# Patient Record
Sex: Female | Born: 1965 | Race: Black or African American | Hispanic: No | Smoking: Former smoker
Health system: Southern US, Community
[De-identification: ages and names within clinical notes are randomized; demographics above are authoritative.]

## PROBLEM LIST (undated history)

## (undated) DIAGNOSIS — E669 Obesity, unspecified: Secondary | ICD-10-CM

## (undated) DIAGNOSIS — J45909 Unspecified asthma, uncomplicated: Secondary | ICD-10-CM

## (undated) DIAGNOSIS — I10 Essential (primary) hypertension: Secondary | ICD-10-CM

## (undated) HISTORY — PX: TUBAL LIGATION: SHX77

## (undated) HISTORY — PX: BREAST REDUCTION SURGERY: SHX8

---

## 2004-09-04 ENCOUNTER — Emergency Department: Payer: Self-pay | Admitting: Emergency Medicine

## 2004-10-03 HISTORY — PX: REDUCTION MAMMAPLASTY: SUR839

## 2005-08-05 ENCOUNTER — Other Ambulatory Visit: Payer: Self-pay

## 2005-08-05 ENCOUNTER — Emergency Department: Payer: Self-pay | Admitting: Emergency Medicine

## 2005-08-08 ENCOUNTER — Ambulatory Visit: Payer: Self-pay | Admitting: Emergency Medicine

## 2012-03-02 ENCOUNTER — Ambulatory Visit: Payer: Self-pay | Admitting: Family Medicine

## 2012-09-30 ENCOUNTER — Emergency Department: Payer: Self-pay | Admitting: Emergency Medicine

## 2013-07-18 ENCOUNTER — Emergency Department: Payer: Self-pay | Admitting: Emergency Medicine

## 2013-07-18 LAB — BASIC METABOLIC PANEL
Calcium, Total: 8.9 mg/dL (ref 8.5–10.1)
Chloride: 110 mmol/L — ABNORMAL HIGH (ref 98–107)
EGFR (Non-African Amer.): >60
Glucose: 82 mg/dL (ref 65–99)
Osmolality: 277 (ref 275–301)
Sodium: 140 mmol/L (ref 136–145)

## 2013-07-18 LAB — CBC
HGB: 11.1 g/dL — ABNORMAL LOW (ref 12.0–16.0)
MCH: 24.5 pg — ABNORMAL LOW (ref 26.0–34.0)
MCHC: 32.3 g/dL (ref 32.0–36.0)
Platelet: 227 10*3/uL (ref 150–440)
RBC: 4.54 10*6/uL (ref 3.80–5.20)
RDW: 15.2 % — ABNORMAL HIGH (ref 11.5–14.5)

## 2013-07-18 LAB — TROPONIN I: Troponin-I: <0.02 ng/mL

## 2013-07-19 LAB — TROPONIN I: Troponin-I: <0.02 ng/mL

## 2014-09-11 DIAGNOSIS — I10 Essential (primary) hypertension: Secondary | ICD-10-CM | POA: Insufficient documentation

## 2014-09-11 DIAGNOSIS — R399 Unspecified symptoms and signs involving the genitourinary system: Secondary | ICD-10-CM | POA: Insufficient documentation

## 2014-09-11 DIAGNOSIS — E039 Hypothyroidism, unspecified: Secondary | ICD-10-CM | POA: Insufficient documentation

## 2014-09-11 HISTORY — DX: Hypothyroidism, unspecified: E03.9

## 2014-09-11 HISTORY — DX: Essential (primary) hypertension: I10

## 2014-12-13 ENCOUNTER — Emergency Department: Payer: Self-pay | Admitting: Emergency Medicine

## 2015-11-17 ENCOUNTER — Emergency Department
Admission: EM | Admit: 2015-11-17 | Discharge: 2015-11-17 | Disposition: A | Discharge status: Home / Self Care | Payer: Managed Care, Other (non HMO) | Attending: Emergency Medicine | Admitting: Emergency Medicine

## 2015-11-17 ENCOUNTER — Emergency Department: Payer: Managed Care, Other (non HMO)

## 2015-11-17 ENCOUNTER — Encounter: Payer: Self-pay | Admitting: Medical Oncology

## 2015-11-17 DIAGNOSIS — R05 Cough: Secondary | ICD-10-CM | POA: Diagnosis not present

## 2015-11-17 DIAGNOSIS — F17200 Nicotine dependence, unspecified, uncomplicated: Secondary | ICD-10-CM | POA: Insufficient documentation

## 2015-11-17 DIAGNOSIS — Z880 Allergy status to penicillin: Secondary | ICD-10-CM | POA: Insufficient documentation

## 2015-11-17 DIAGNOSIS — R079 Chest pain, unspecified: Secondary | ICD-10-CM | POA: Diagnosis present

## 2015-11-17 DIAGNOSIS — R0789 Other chest pain: Secondary | ICD-10-CM | POA: Diagnosis not present

## 2015-11-17 DIAGNOSIS — I10 Essential (primary) hypertension: Secondary | ICD-10-CM | POA: Insufficient documentation

## 2015-11-17 DIAGNOSIS — Z79899 Other long term (current) drug therapy: Secondary | ICD-10-CM | POA: Insufficient documentation

## 2015-11-17 DIAGNOSIS — R059 Cough, unspecified: Secondary | ICD-10-CM

## 2015-11-17 HISTORY — DX: Essential (primary) hypertension: I10

## 2015-11-17 LAB — BASIC METABOLIC PANEL
ANION GAP: 7 (ref 5–15)
BUN: 8 mg/dL (ref 6–20)
CALCIUM: 9.3 mg/dL (ref 8.9–10.3)
CO2: 28 mmol/L (ref 22–32)
Chloride: 105 mmol/L (ref 101–111)
Creatinine, Ser: 0.81 mg/dL (ref 0.44–1.00)
GFR calc Af Amer: >60 mL/min (ref >60)
GLUCOSE: 101 mg/dL — AB (ref 65–99)
Potassium: 3.7 mmol/L (ref 3.5–5.1)
SODIUM: 140 mmol/L (ref 135–145)

## 2015-11-17 LAB — CBC
HCT: 40.5 % (ref 35.0–47.0)
HEMOGLOBIN: 12.9 g/dL (ref 12.0–16.0)
MCH: 25.6 pg — ABNORMAL LOW (ref 26.0–34.0)
MCHC: 31.9 g/dL — AB (ref 32.0–36.0)
MCV: 80.2 fL (ref 80.0–100.0)
Platelets: 180 10*3/uL (ref 150–440)
RBC: 5.05 MIL/uL (ref 3.80–5.20)
RDW: 14.3 % (ref 11.5–14.5)
WBC: 5.6 10*3/uL (ref 3.6–11.0)

## 2015-11-17 LAB — TROPONIN I

## 2015-11-17 MED ORDER — KETOROLAC TROMETHAMINE 30 MG/ML IJ SOLN
30.0000 mg | Freq: Once | INTRAMUSCULAR | Status: AC
  Administered 2015-11-17: 30 mg via INTRAVENOUS
  Filled 2015-11-17: qty 1

## 2015-11-17 MED ORDER — NAPROXEN SODIUM 275 MG PO TABS: 275.0000 mg | ORAL_TABLET | Freq: Two times a day (BID) | ORAL | Status: AC

## 2015-11-17 MED ORDER — HYDROCHLOROTHIAZIDE 25 MG PO TABS
25.0000 mg | ORAL_TABLET | Freq: Every day | ORAL | Status: DC
  Administered 2015-11-17: 25 mg via ORAL
  Filled 2015-11-17: qty 1

## 2015-11-17 NOTE — ED Notes (Signed)
Pt reports she was at work this am when she began having left sided sharp pain to chest, pain is also in left arm. Pt denies sob.

## 2015-11-17 NOTE — Discharge Instructions (Signed)
Chest Wall Pain You have prefer to go home at this time which is certainly not unreasonable. However, we do notice that your blood pressure is quite elevated. Please follow-up with your doctor tomorrow. If you have any change in your chest pain, change your mind about further workup, have shortness of breath, nausea or vomiting, headache, or you feel worse in any way return to the emergency room. Take your regular home medications without fail and to see your doctor today or tomorrow for further evaluation. Chest wall pain is pain in or around the bones and muscles of your chest. Sometimes, an injury causes this pain. Sometimes, the cause may not be known. This pain may take several weeks or longer to get better. HOME CARE INSTRUCTIONS  Pay attention to any changes in your symptoms. Take these actions to help with your pain:   Rest as told by your health care provider.   Avoid activities that cause pain. These include any activities that use your chest muscles or your abdominal and side muscles to lift heavy items.   If directed, apply ice to the painful area:  Put ice in a plastic bag.  Place a towel between your skin and the bag.  Leave the ice on for 20 minutes, 2-3 times per day.  Take over-the-counter and prescription medicines only as told by your health care provider.  Do not use tobacco products, including cigarettes, chewing tobacco, and e-cigarettes. If you need help quitting, ask your health care provider.  Keep all follow-up visits as told by your health care provider. This is important. SEEK MEDICAL CARE IF:  You have a fever.  Your chest pain becomes worse.  You have new symptoms. SEEK IMMEDIATE MEDICAL CARE IF:  You have nausea or vomiting.  You feel sweaty or light-headed.  You have a cough with phlegm (sputum) or you cough up blood.  You develop shortness of breath.   This information is not intended to replace advice given to you by your health care  provider. Make sure you discuss any questions you have with your health care provider.   Document Released: 09/19/2005 Document Revised: 06/10/2015 Document Reviewed: 12/15/2014 Elsevier Interactive Patient Education [employer].

## 2015-11-17 NOTE — ED Notes (Signed)
Pt has hx HTN, is supposed to take medication but says she has been unable to fill her prescription recently d/t only having insurance 9/12 months of the year. Pt given info on medication management resources. States she will call this week.

## 2015-11-17 NOTE — ED Provider Notes (Addendum)
[facility] Emergency Department Provider Note  ____________________________________________   I have reviewed the triage vital signs and the nursing notes.   HISTORY  Chief Complaint Chest Pain    HPI Peggy Villa is a 50 y.o. female presents today complaining of left-sided chest wall pain. Patient has been coughing and she states she had pain this morning when she coughed "real hard" once. And then, she was reaching down to pick something up with her left arm and hurt her chest wall even more. She has pain in the left chest wall when she changes position or when she touches it or when she lifts her arm. She has had no fever or chills. She denies any leg swelling. She states that the only shortness of breath she has is when she coughs. She states the cough is occasionally productive. She has no numbness or weakness. When I ask her to reproduce the pain she touches the chest wall states "it's sore right here" and she also can reproduce it by moving her left arm.She denies any personal or family history of PE or DVT, she has had no recent travel takes no exogenous estrogens and has had no lower extremity swelling. She denies any recent surgery. He also denies family history of CAD. Patient is an active smoker with a history of hypertension  Past Medical History  Diagnosis Date  . Hypertension     There are no active problems to display for this patient.   Past Surgical History  Procedure Laterality Date  . Breast reduction surgery      Current Outpatient Rx  Name  Route  Sig  Dispense  Refill  . dextromethorphan-guaiFENesin (MUCINEX DM) 30-600 MG 12hr tablet   Oral   Take 1 tablet by mouth at bedtime.         . naproxen sodium (ANAPROX) 275 MG tablet   Oral   Take 1 tablet (275 mg total) by mouth 2 (two) times daily with a meal.   30 tablet   2     Allergies Milk-related compounds; Peanut-containing drug products; and Penicillins  No family  history on file.  Social History Social History  Substance Use Topics  . Smoking status: Current Every Day Smoker  . Smokeless tobacco: None  . Alcohol Use: Yes     Comment: socially    Review of Systems Constitutional: No fever/chills Eyes: No visual changes. ENT: No sore throat. No stiff neck no neck pain Cardiovascular:See history of present illness regardingt pain. Respiratory:Positive cough.  Gastrointestinal:   no vomiting.  No diarrhea.  No constipation. Genitourinary: Negative for dysuria. Musculoskeletal: Negative lower extremity swelling Skin: Negative for rash. Neurological: Negative for headaches, focal weakness or numbness. 10-point ROS otherwise negative.  ____________________________________________   PHYSICAL EXAM:  VITAL SIGNS: ED Triage Vitals  Enc Vitals Group     BP 11/17/15 0942 196/103 mmHg     Pulse Rate 11/17/15 0942 75     Resp 11/17/15 0942 20     Temp 11/17/15 0942 98.3 F (36.8 C)     Temp Source 11/17/15 0942 Oral     SpO2 11/17/15 0942 99 %     Weight 11/17/15 0942 273 lb (123.832 kg)     Height 11/17/15 0942 5\' 8"  (1.727 m)     Head Cir --      Peak Flow --      Pain Score 11/17/15 0943 6     Pain Loc --  Pain Edu? --      Excl. in [place]? --     Constitutional: Alert and oriented. Well appearing and in no acute distress. Eyes: Conjunctivae are normal. PERRL. EOMI. Head: Atraumatic. Nose: No congestion/rhinnorhea. Mouth/Throat: Mucous membranes are moist.  Oropharynx non-erythematous. Neck: No stridor.   Nontender with no meningismus  chest: Tender to palpation left chest wall and I touch the pectoralis muscle patient states "ouch that's the pain right there" and pulls back. I can also list this pain by having her lift something heavy with a left arm or push me away with her left arm. There is no crepitus there is no flail chest. Cardiovascular: Normal rate, regular rhythm. Grossly normal heart sounds.  Good peripheral  circulation. Respiratory: Normal respiratory effort.  No retractions. Lungs CTAB. Abdominal: Soft and nontender. No distention. No guarding no rebound Back:  There is no focal tenderness or step off there is no midline tenderness there are no lesions noted. there is no CVA tenderness Musculoskeletal: No lower extremity tenderness. No joint effusions, no DVT signs strong distal pulses no edema Neurologic:  Normal speech and language. No gross focal neurologic deficits are appreciated.  Skin:  Skin is warm, dry and intact. No rash noted. Psychiatric: Mood and affect are normal. Speech and behavior are normal.  ____________________________________________   LABS (all labs ordered are listed, but only abnormal results are displayed)  Labs Reviewed  BASIC METABOLIC PANEL - Abnormal; Notable for the following:    Glucose, Bld 101 (*)    All other components within normal limits  CBC - Abnormal; Notable for the following:    MCH 25.6 (*)    MCHC 31.9 (*)    All other components within normal limits  TROPONIN I  TROPONIN I   ____________________________________________  EKG  I personally interpreted any EKGs ordered by me or triage Normal sinus rhythm at 71 bpm possible old anterior infarct previously seen on prior EKG, no acute ST elevation or acute ST depression.  ____________________________________________  [id number]  I reviewed any imaging ordered by me or triage that were performed during my shift ____________________________________________   PROCEDURES  Procedure(s) performed: None  Critical Care performed: None  ____________________________________________   INITIAL IMPRESSION / ASSESSMENT AND PLAN / ED COURSE  Pertinent labs & imaging results that were available during my care of the patient were reviewed by me and considered in my medical decision making (see chart for details). Patient presents with very reproducible chest wall pain in the context of a cough.  Made worse by having lifting. Very low suspicion for ACS but she does have risk factors therefore we'll check cardiac enzymes and reassess. I will give her Toradol to see if I can help her pain. Her abdomen is benign there is no evidence of referred abdominal pain. History and physical not suggestive of PE or dissection at this time.   ----------------------------------------- 1:22 PM on 11/17/2015 -----------------------------------------  Patient remains absolutely comfortable and less I touch her chest wall she changes position the runway. The pain medication greatly helped her discomfort. Her blood pressure somewhat elevated here, however she did not take her morning blood pressure medication. We'll give that to her. We will keep her for second cardiac markers a precaution although again very low suspicion for ACS on this patient. That is negative, I feel that she'll be safe for discharge.At this time, there does not appear to be clinical evidence to support the diagnosis of pulmonary embolus, dissection, myocarditis, endocarditis, pericarditis, pericardial tamponade,  acute coronary syndrome, pneumothorax, pneumonia, or any other acute intrathoracic pathology that will require admission or acute intervention. Nor is there evidence of any significant intra-abdominal pathology causing this discomfort. Patient feels very comfortable with this plan  ----------------------------------------- 3:35 PM on 11/17/2015 -----------------------------------------  Patient states he be some blood pressures between 180 and 190 on an average day. This is where we are approximately. She did not take her blood pressure medications so was slightly higher than this. It is trending down. She would very much prefer not to stay in the hospital for 1 minute longer. She E discharge. She does not wish to be admitted to the hospital. She states she feels much better. Given that there is no evidence of ongoing ischemia, she is  at her baseline blood pressure she has very reproducible chest wall pain, and she has 2 negative troponins and she is requesting discharge we will discharge her. She has been discussing with her primary care doctor already and they will follow-up today or tomorrow for a change in her baseline blood pressure medication we have advised her very strongly to be compliant. Patient does state that she normally only takes half her medications. She has no headache no evidence of acute renal injury, and again she states this is her baseline blood pressure. ____________________________________________   FINAL CLINICAL IMPRESSION(S) / ED DIAGNOSES  Final diagnoses:  Cough  Chest wall pain      This chart was dictated using voice recognition software.  Despite best efforts to proofread,  errors can occur which can change meaning.     Schuyler Amor, MD 11/17/15 1016  Schuyler Amor, MD 11/17/15 [place], MD 11/17/15 [place], MD 11/17/15 [phone]

## 2016-01-05 ENCOUNTER — Ambulatory Visit: Payer: Self-pay | Admitting: Unknown Physician Specialty

## 2016-03-03 ENCOUNTER — Emergency Department: Payer: Self-pay

## 2016-03-03 ENCOUNTER — Emergency Department
Admission: EM | Admit: 2016-03-03 | Discharge: 2016-03-03 | Disposition: A | Discharge status: Home / Self Care | Payer: Self-pay | Attending: Emergency Medicine | Admitting: Emergency Medicine

## 2016-03-03 DIAGNOSIS — N390 Urinary tract infection, site not specified: Secondary | ICD-10-CM | POA: Insufficient documentation

## 2016-03-03 DIAGNOSIS — F17200 Nicotine dependence, unspecified, uncomplicated: Secondary | ICD-10-CM | POA: Insufficient documentation

## 2016-03-03 DIAGNOSIS — I10 Essential (primary) hypertension: Secondary | ICD-10-CM | POA: Insufficient documentation

## 2016-03-03 LAB — URINALYSIS COMPLETE WITH MICROSCOPIC (ARMC ONLY)
Bilirubin Urine: NEGATIVE
Glucose, UA: NEGATIVE mg/dL
KETONES UR: NEGATIVE mg/dL
Nitrite: NEGATIVE
PH: 9 — AB (ref 5.0–8.0)
PROTEIN: 100 mg/dL — AB
SPECIFIC GRAVITY, URINE: 1.015 (ref 1.005–1.030)
Squamous Epithelial / LPF: NONE SEEN

## 2016-03-03 LAB — POCT PREGNANCY, URINE: PREG TEST UR: NEGATIVE

## 2016-03-03 MED ORDER — ONDANSETRON 4 MG PO TBDP
4.0000 mg | ORAL_TABLET | Freq: Once | ORAL | Status: AC
  Administered 2016-03-03: 4 mg via ORAL
  Filled 2016-03-03: qty 1

## 2016-03-03 MED ORDER — CIPROFLOXACIN HCL 500 MG PO TABS
500.0000 mg | ORAL_TABLET | Freq: Two times a day (BID) | ORAL | Status: AC
Start: 2016-03-03 — End: 2016-03-12

## 2016-03-03 MED ORDER — CIPROFLOXACIN HCL 500 MG PO TABS
500.0000 mg | ORAL_TABLET | Freq: Once | ORAL | Status: AC
  Administered 2016-03-03: 500 mg via ORAL
  Filled 2016-03-03: qty 1

## 2016-03-03 NOTE — ED Provider Notes (Signed)
[facility] Emergency Department Provider Note  ____________________________________________  Time seen: 3:10 AM  I have reviewed the triage vital signs and the nursing notes.   HISTORY  Chief Complaint Dysuria      HPI Peggy Villa is a 50 y.o. female presents with acute onset of dysuria, urinary urgency and low back pain with onset at approximately 10 PM last night. Patient admits to nausea but no vomiting. Patient denies any diarrhea. No pain at this time.     Past Medical History  Diagnosis Date  . Hypertension     There are no active problems to display for this patient.   Past Surgical History  Procedure Laterality Date  . Breast reduction surgery      Current Outpatient Rx  Name  Route  Sig  Dispense  Refill  . ciprofloxacin (CIPRO) 500 MG tablet   Oral   Take 1 tablet (500 mg total) by mouth 2 (two) times daily.   20 tablet   0   . dextromethorphan-guaiFENesin (MUCINEX DM) 30-600 MG 12hr tablet   Oral   Take 1 tablet by mouth at bedtime.         . naproxen sodium (ANAPROX) 275 MG tablet   Oral   Take 1 tablet (275 mg total) by mouth 2 (two) times daily with a meal.   30 tablet   2     Allergies Milk-related compounds; Peanut-containing drug products; and Penicillins  No family history on file.  Social History Social History  Substance Use Topics  . Smoking status: Current Every Day Smoker  . Smokeless tobacco: Not on file  . Alcohol Use: Yes     Comment: socially    Review of Systems  Constitutional: Negative for fever. Eyes: Negative for visual changes. ENT: Negative for sore throat. Cardiovascular: Negative for chest pain. Respiratory: Negative for shortness of breath. Gastrointestinal: Negative for abdominal pain, vomiting and diarrhea. Genitourinary: Positive for dysuria. Musculoskeletal: Positive for back pain. Skin: Negative for rash. Neurological: Negative for headaches, focal weakness or  numbness.   10-point ROS otherwise negative.  ____________________________________________   PHYSICAL EXAM:  VITAL SIGNS: ED Triage Vitals  Enc Vitals Group     BP 03/03/16 0327 135/90 mmHg     Pulse Rate 03/03/16 0214 94     Resp 03/03/16 0214 18     Temp 03/03/16 0214 98.6 F (37 C)     Temp Source 03/03/16 0214 Oral     SpO2 03/03/16 0214 100 %     Weight 03/03/16 0214 267 lb (121.11 kg)     Height 03/03/16 0214 5\' 8"  (1.727 m)     Head Cir --      Peak Flow --      Pain Score 03/03/16 0215 3     Pain Loc --      Pain Edu? --      Excl. in [place]? --     Constitutional: Alert and oriented. Well appearing and in no distress. Eyes: Conjunctivae are normal. PERRL. Normal extraocular movements. ENT   Head: Normocephalic and atraumatic.   Nose: No congestion/rhinnorhea.   Mouth/Throat: Mucous membranes are moist.   Neck: No stridor. Hematological/Lymphatic/Immunilogical: No cervical lymphadenopathy. Cardiovascular: Normal rate, regular rhythm. Normal and symmetric distal pulses are present in all extremities. No murmurs, rubs, or gallops. Respiratory: Normal respiratory effort without tachypnea nor retractions. Breath sounds are clear and equal bilaterally. No wheezes/rales/rhonchi. Gastrointestinal: Soft and nontender. No distention. There is no CVA tenderness. Genitourinary:  deferred Musculoskeletal: Nontender with normal range of motion in all extremities. No joint effusions.  No lower extremity tenderness nor edema. Neurologic:  Normal speech and language. No gross focal neurologic deficits are appreciated. Speech is normal.  Skin:  Skin is warm, dry and intact. No rash noted. Psychiatric: Mood and affect are normal. Speech and behavior are normal. Patient exhibits appropriate insight and judgment.  ____________________________________________    LABS (pertinent positives/negatives)  Labs Reviewed  URINALYSIS COMPLETEWITH MICROSCOPIC ([place] ONLY) -  Abnormal; Notable for the following:    Color, Urine YELLOW (*)    APPearance TURBID (*)    Hgb urine dipstick 3+ (*)    pH 9.0 (*)    Protein, ur 100 (*)    Leukocytes, UA 3+ (*)    Bacteria, UA RARE (*)    All other components within normal limits  POC URINE PREG, ED  POCT PREGNANCY, URINE       INITIAL IMPRESSION / ASSESSMENT AND PLAN / ED COURSE  Pertinent labs & imaging results that were available during my care of the patient were reviewed by me and considered in my medical decision making (see chart for details).  Patient given Cipro in emergency department will be prescribed same for home  ____________________________________________   FINAL CLINICAL IMPRESSION(S) / ED DIAGNOSES  Final diagnoses:  UTI (lower urinary tract infection)      Gregor Hams, MD 03/03/16 0410

## 2016-03-03 NOTE — Discharge Instructions (Signed)

## 2016-03-03 NOTE — ED Notes (Signed)
Pt in with co lower back pain since tonight, states has had urinary urgency and painful urination.

## 2016-12-02 DIAGNOSIS — M25519 Pain in unspecified shoulder: Secondary | ICD-10-CM | POA: Insufficient documentation

## 2016-12-02 HISTORY — DX: Pain in unspecified shoulder: M25.519

## 2017-02-21 ENCOUNTER — Other Ambulatory Visit: Payer: Self-pay | Admitting: Family Medicine

## 2017-02-21 DIAGNOSIS — Z1231 Encounter for screening mammogram for malignant neoplasm of breast: Secondary | ICD-10-CM

## 2017-02-28 ENCOUNTER — Encounter: Payer: Self-pay | Admitting: Radiology

## 2017-02-28 ENCOUNTER — Ambulatory Visit
Admission: RE | Admit: 2017-02-28 | Discharge: 2017-02-28 | Disposition: A | Discharge status: Home / Self Care | Payer: Managed Care, Other (non HMO) | Source: Ambulatory Visit | Attending: Family Medicine | Admitting: Family Medicine

## 2017-02-28 DIAGNOSIS — Z1231 Encounter for screening mammogram for malignant neoplasm of breast: Secondary | ICD-10-CM | POA: Diagnosis present

## 2017-10-30 ENCOUNTER — Emergency Department
Admission: EM | Admit: 2017-10-30 | Discharge: 2017-10-30 | Disposition: A | Discharge status: Home / Self Care | Payer: Managed Care, Other (non HMO) | Attending: Emergency Medicine | Admitting: Emergency Medicine

## 2017-10-30 ENCOUNTER — Encounter: Payer: Self-pay | Admitting: Intensive Care

## 2017-10-30 DIAGNOSIS — F17200 Nicotine dependence, unspecified, uncomplicated: Secondary | ICD-10-CM | POA: Diagnosis not present

## 2017-10-30 DIAGNOSIS — I10 Essential (primary) hypertension: Secondary | ICD-10-CM | POA: Diagnosis not present

## 2017-10-30 DIAGNOSIS — R220 Localized swelling, mass and lump, head: Secondary | ICD-10-CM | POA: Diagnosis present

## 2017-10-30 DIAGNOSIS — Z91010 Allergy to peanuts: Secondary | ICD-10-CM | POA: Insufficient documentation

## 2017-10-30 DIAGNOSIS — L235 Allergic contact dermatitis due to other chemical products: Secondary | ICD-10-CM | POA: Diagnosis not present

## 2017-10-30 MED ORDER — DEXAMETHASONE SODIUM PHOSPHATE 10 MG/ML IJ SOLN
10.0000 mg | Freq: Once | INTRAMUSCULAR | Status: AC
  Administered 2017-10-30: 10 mg via INTRAMUSCULAR
  Filled 2017-10-30: qty 1

## 2017-10-30 MED ORDER — PREDNISONE 10 MG PO TABS: ORAL_TABLET | ORAL | 0 refills | Status: DC

## 2017-10-30 NOTE — Discharge Instructions (Signed)
Follow-up with your primary care provider if any continued problems.  You may continue taking Benadryl if needed for itching.  Begin prednisone tapering dose today.  Discontinue using the rinse that was used.  Avoid scratching the area to decrease risk of infection. Also follow-up with your primary care provider to have your blood pressure rechecked.  Today it was elevated at 147/104.

## 2017-10-30 NOTE — ED Triage Notes (Signed)
FIRST NURSE NOTE-facial swelling left periorbital area since this AM. Took benadryl 0730.  Vision changes because of swelling per pt

## 2017-10-30 NOTE — ED Provider Notes (Signed)
[facility] Emergency Department Provider Note  ____________________________________________   First MD Initiated Contact with Patient 10/30/17 1251     (approximate)  I have reviewed the triage vital signs and the nursing notes.   HISTORY  Chief Complaint Facial Swelling   HPI Peggy Villa is a 52 y.o. female is here with complaint of itching of the scalp.  Patient states that she used a new rinse on her hair Friday night and woke up Saturday morning with some swelling to the left side of her face and irritation to the back of her neck.  This is continued to expand to her entire scalp.  She states it is extremely itchy and Benadryl at home has not been helping.  Patient denies any difficulty breathing or swallowing.  She last took Benadryl at 7:30 AM.  She states in the past she has had to take steroids for similar episode.   Past Medical History:  Diagnosis Date  . Hypertension     There are no active problems to display for this patient.   Past Surgical History:  Procedure Laterality Date  . BREAST REDUCTION SURGERY    . REDUCTION MAMMAPLASTY Bilateral 2006    Prior to Admission medications   Medication Sig Start Date End Date Taking? Authorizing Provider  dextromethorphan-guaiFENesin (MUCINEX DM) 30-600 MG 12hr tablet Take 1 tablet by mouth at bedtime.    [provider]  predniSONE (DELTASONE) 10 MG tablet Take 6 tablets  today, on day 2 take 5 tablets, day 3 take 4 tablets, day 4 take 3 tablets, day 5 take  2 tablets and 1 tablet the last day 10/30/17   Johnn Hai, PA-C    Allergies Milk-related compounds; Peanut-containing drug products; and Penicillins  Family History  Problem Relation Age of Onset  . Breast cancer Neg Hx     Social History Social History   Tobacco Use  . Smoking status: Current Every Day Smoker  . Smokeless tobacco: Never Used  Substance Use Topics  . Alcohol use: Yes    Comment: socially    . Drug use: Not on file    Review of Systems Constitutional: No fever/chills Eyes: No visual changes other than left eyelids swollen. ENT: No sore throat. Cardiovascular: Denies chest pain. Respiratory: Denies shortness of breath. Gastrointestinal: No abdominal pain.  No nausea, no vomiting.  Musculoskeletal: Negative for back pain. Skin: Positive for skin rash scalp. Neurological: Negative for headaches, focal weakness or numbness. ____________________________________________   PHYSICAL EXAM:  VITAL SIGNS: ED Triage Vitals  Enc Vitals Group     BP 10/30/17 1228 (!) 147/104     Pulse Rate 10/30/17 1228 88     Resp 10/30/17 1228 16     Temp 10/30/17 1228 98 F (36.7 C)     Temp Source 10/30/17 1228 Oral     SpO2 10/30/17 1228 96 %     Weight 10/30/17 1227 290 lb (131.5 kg)     Height 10/30/17 1227 5\' 7"  (1.702 m)     Head Circumference --      Peak Flow --      Pain Score --      Pain Loc --      Pain Edu? --      Excl. in [place]? --    Constitutional: Alert and oriented. Well appearing and in no acute distress. Eyes: Conjunctivae are normal.  Head: Atraumatic. Neck: No stridor.   Hematological/Lymphatic/Immunilogical: No cervical lymphadenopathy. Cardiovascular: Normal rate, regular  rhythm. Grossly normal heart sounds.  Good peripheral circulation. Respiratory: Normal respiratory effort.  No retractions. Lungs CTAB. Musculoskeletal: Moves upper and lower extremities without any difficulty.  Normal gait was noted. Neurologic:  Normal speech and language. No gross focal neurologic deficits are appreciated.  Skin:  Skin is warm, dry and intact.  Scalp is extremely erythematous and also has a rash to his scalp and along the hairline on the forehead.  No open areas were noted.  Patient has minimal soft tissue swelling of her face and without erythema. Psychiatric: Mood and affect are normal. Speech and behavior are normal.  ____________________________________________    LABS (all labs ordered are listed, but only abnormal results are displayed)  Labs Reviewed - No data to display __________________________________________   PROCEDURES  Procedure(s) performed: None  Procedures  Critical Care performed: No  ____________________________________________   INITIAL IMPRESSION / ASSESSMENT AND PLAN / ED COURSE Patient will need to follow-up with her PCP to have her blood pressure rechecked.  Today it was elevated in triage.  Patient was given Decadron 10 mg IM in the department.  She is also given a prescription for prednisone taper starting at 60 mg.  She was made aware that she could continue taking Benadryl if needed for itching.  She will discard any hair rinse that she has left.  She will also follow-up with her PCP if any continued problems.  ____________________________________________   FINAL CLINICAL IMPRESSION(S) / ED DIAGNOSES  Final diagnoses:  Allergic contact dermatitis due to chemical     ED Discharge Orders        Ordered    predniSONE (DELTASONE) 10 MG tablet     10/30/17 1306       Note:  This document was prepared using Dragon voice recognition software and may include unintentional dictation errors.    Johnn Hai, PA-C 10/30/17 1606    Earleen Newport, MD 10/31/17 [phone]

## 2017-10-30 NOTE — ED Notes (Signed)
See triage note  States used a rinse on her hair   Developed some facial swelling afterwards  Took benadryl with results  Woke up this am with more swelling  Took benadryl this  No diff swallowing

## 2017-10-30 NOTE — ED Triage Notes (Signed)
Patient used new rinse in her hair Friday night and woke up Saturday with swelling to L side of face. Denies SOB or CP. No swelling to throat. Denies trouble eating/drinking. Pt has been taking bendadryl at home and last took this AM at 0730. No distress noted in triage

## 2018-12-03 ENCOUNTER — Other Ambulatory Visit: Payer: Self-pay | Admitting: Family Medicine

## 2018-12-03 DIAGNOSIS — Z1231 Encounter for screening mammogram for malignant neoplasm of breast: Secondary | ICD-10-CM

## 2018-12-17 ENCOUNTER — Encounter: Payer: Self-pay | Admitting: Emergency Medicine

## 2018-12-17 ENCOUNTER — Other Ambulatory Visit: Payer: Self-pay

## 2018-12-17 ENCOUNTER — Ambulatory Visit
Admission: EM | Admit: 2018-12-17 | Discharge: 2018-12-17 | Disposition: A | Discharge status: Home / Self Care | Payer: 59 | Attending: Family Medicine | Admitting: Family Medicine

## 2018-12-17 DIAGNOSIS — S161XXA Strain of muscle, fascia and tendon at neck level, initial encounter: Secondary | ICD-10-CM

## 2018-12-17 DIAGNOSIS — I10 Essential (primary) hypertension: Secondary | ICD-10-CM | POA: Diagnosis not present

## 2018-12-17 DIAGNOSIS — S39012A Strain of muscle, fascia and tendon of lower back, initial encounter: Secondary | ICD-10-CM

## 2018-12-17 DIAGNOSIS — S29012A Strain of muscle and tendon of back wall of thorax, initial encounter: Secondary | ICD-10-CM | POA: Diagnosis not present

## 2018-12-17 MED ORDER — CYCLOBENZAPRINE HCL 10 MG PO TABS: 10.0000 mg | ORAL_TABLET | Freq: Three times a day (TID) | ORAL | 0 refills | Status: DC | PRN

## 2018-12-17 MED ORDER — IBUPROFEN 800 MG PO TABS: 800.0000 mg | ORAL_TABLET | Freq: Three times a day (TID) | ORAL | 0 refills | Status: DC

## 2018-12-17 NOTE — ED Provider Notes (Signed)
[facility]    CSN: [id number] Arrival date & time: 12/17/18  1652     History   Chief Complaint Chief Complaint  Patient presents with  . Back Pain    APPT    HPI Peggy Villa is a 53 y.o. female.   53 yo female with a c/o neck and upper back muscle pain for the last 3 days. Denies any falls, injuries, swelling, redness, rash, fevers, chills.   The history is provided by the patient.  Back Pain    Past Medical History:  Diagnosis Date  . Hypertension     There are no active problems to display for this patient.   Past Surgical History:  Procedure Laterality Date  . BREAST REDUCTION SURGERY    . REDUCTION MAMMAPLASTY Bilateral 2006    OB History   No obstetric history on file.      Home Medications    Prior to Admission medications   Medication Sig Start Date End Date Taking? Authorizing Provider  amLODIPine Besylate (NORVASC PO) amlodipine   Yes [provider]  escitalopram (LEXAPRO) 10 MG tablet Take 10 mg by mouth daily.   Yes [provider]  cyclobenzaprine (FLEXERIL) 10 MG tablet Take 1 tablet (10 mg total) by mouth 3 (three) times daily as needed for muscle spasms. 12/17/18   Norval Gable, MD  dextromethorphan-guaiFENesin [facility] DM) 30-600 MG 12hr tablet Take 1 tablet by mouth at bedtime.    [provider]  hydroCHLOROthiazide (MICROZIDE PO) hydrochlorothiazide    [provider]  ibuprofen (ADVIL,MOTRIN) 800 MG tablet Take 1 tablet (800 mg total) by mouth 3 (three) times daily. 12/17/18   Norval Gable, MD  predniSONE (DELTASONE) 10 MG tablet Take 6 tablets  today, on day 2 take 5 tablets, day 3 take 4 tablets, day 4 take 3 tablets, day 5 take  2 tablets and 1 tablet the last day 10/30/17   Johnn Hai, PA-C    Family History Family History  Problem Relation Age of Onset  . Lupus Mother   . Cancer Father   . Breast cancer Neg Hx     Social History Social History   Tobacco Use   . Smoking status: Current Every Day Smoker    Packs/day: 0.25  . Smokeless tobacco: Never Used  Substance Use Topics  . Alcohol use: Yes    Comment: socially  . Drug use: Not Currently     Allergies   Milk-related compounds; Peanut-containing drug products; and Penicillins   Review of Systems Review of Systems  Musculoskeletal: Positive for back pain.     Physical Exam Triage Vital Signs ED Triage Vitals  Enc Vitals Group     BP 12/17/18 1723 (!) 158/88     Pulse Rate 12/17/18 1723 77     Resp 12/17/18 1723 18     Temp 12/17/18 1723 98.4 F (36.9 C)     Temp Source 12/17/18 1723 Oral     SpO2 12/17/18 1723 96 %     Weight 12/17/18 1718 295 lb (133.8 kg)     Height 12/17/18 1718 5\' 7"  (4.098 m)     Head Circumference --      Peak Flow --      Pain Score 12/17/18 1717 6     Pain Loc --      Pain Edu? --      Excl. in [place]? --    No data found.  Updated Vital Signs BP (!) 158/88 (  BP Location: Left Arm)   Pulse 77   Temp 98.4 F (36.9 C) (Oral)   Resp 18   Ht 5\' 7"  (1.702 m)   Wt 133.8 kg   LMP 02/01/2016   SpO2 96%   BMI 46.20 kg/m   Visual Acuity Right Eye Distance:   Left Eye Distance:   Bilateral Distance:    Right Eye Near:   Left Eye Near:    Bilateral Near:     Physical Exam Vitals signs reviewed.  Constitutional:      General: She is not in acute distress.    Appearance: She is not toxic-appearing or diaphoretic.  Musculoskeletal:     Cervical back: She exhibits tenderness (over the trapezius muscles bilaterally and paraspinous muscles) and spasm. She exhibits normal range of motion, no swelling, no edema, no laceration and normal pulse.  Neurological:     Mental Status: She is alert.      UC Treatments / Results  Labs (all labs ordered are listed, but only abnormal results are displayed) Labs Reviewed - No data to display  EKG None  Radiology No results found.  Procedures Procedures (including critical care time)   Medications Ordered in UC Medications - No data to display  Initial Impression / Assessment and Plan / UC Course  I have reviewed the triage vital signs and the nursing notes.  Pertinent labs & imaging results that were available during my care of the patient were reviewed by me and considered in my medical decision making (see chart for details).      Final Clinical Impressions(s) / UC Diagnoses   Final diagnoses:  Strain of neck muscle, initial encounter  Back strain, initial encounter     Discharge Instructions     Heat, stretching    ED Prescriptions    Medication Sig Dispense Auth. Provider   cyclobenzaprine (FLEXERIL) 10 MG tablet Take 1 tablet (10 mg total) by mouth 3 (three) times daily as needed for muscle spasms. 30 tablet Norval Gable, MD   ibuprofen (ADVIL,MOTRIN) 800 MG tablet Take 1 tablet (800 mg total) by mouth 3 (three) times daily. 21 tablet Norval Gable, MD     1. diagnosis reviewed with patient 2. rx as per orders above; reviewed possible side effects, interactions, risks and benefits  3. Recommend supportive treatment as above 4. Follow-up prn if symptoms worsen or don't improve   Controlled Substance Prescriptions [place] Controlled Substance Registry consulted? Not Applicable   Norval Gable, MD 12/17/18 [phone]

## 2018-12-17 NOTE — ED Triage Notes (Signed)
Pt states that she is having muscle spasm and a burning sensation in her upper back. No known injury. Started yesterday. She has taken ibuprofen today but did not help much.

## 2018-12-17 NOTE — Discharge Instructions (Signed)
Heat, stretching

## 2018-12-26 ENCOUNTER — Other Ambulatory Visit: Payer: Self-pay

## 2018-12-26 ENCOUNTER — Emergency Department: Payer: 59

## 2018-12-26 ENCOUNTER — Encounter: Payer: Self-pay | Admitting: Emergency Medicine

## 2018-12-26 ENCOUNTER — Emergency Department
Admission: EM | Admit: 2018-12-26 | Discharge: 2018-12-26 | Disposition: A | Discharge status: Home / Self Care | Payer: 59 | Attending: Emergency Medicine | Admitting: Emergency Medicine

## 2018-12-26 DIAGNOSIS — Z87891 Personal history of nicotine dependence: Secondary | ICD-10-CM | POA: Diagnosis not present

## 2018-12-26 DIAGNOSIS — Z79899 Other long term (current) drug therapy: Secondary | ICD-10-CM | POA: Insufficient documentation

## 2018-12-26 DIAGNOSIS — Z91010 Allergy to peanuts: Secondary | ICD-10-CM | POA: Insufficient documentation

## 2018-12-26 DIAGNOSIS — I10 Essential (primary) hypertension: Secondary | ICD-10-CM | POA: Insufficient documentation

## 2018-12-26 DIAGNOSIS — R0602 Shortness of breath: Secondary | ICD-10-CM | POA: Insufficient documentation

## 2018-12-26 DIAGNOSIS — J45901 Unspecified asthma with (acute) exacerbation: Secondary | ICD-10-CM

## 2018-12-26 DIAGNOSIS — J4541 Moderate persistent asthma with (acute) exacerbation: Secondary | ICD-10-CM | POA: Insufficient documentation

## 2018-12-26 DIAGNOSIS — R062 Wheezing: Secondary | ICD-10-CM | POA: Diagnosis present

## 2018-12-26 HISTORY — DX: Obesity, unspecified: E66.9

## 2018-12-26 HISTORY — DX: Unspecified asthma, uncomplicated: J45.909

## 2018-12-26 LAB — CBC WITH DIFFERENTIAL/PLATELET
Abs Immature Granulocytes: 0.02 10*3/uL (ref 0.00–0.07)
Basophils Absolute: 0 10*3/uL (ref 0.0–0.1)
Basophils Relative: 0 %
EOS ABS: 0.2 10*3/uL (ref 0.0–0.5)
Eosinophils Relative: 2 %
HCT: 41.2 % (ref 36.0–46.0)
Hemoglobin: 12.6 g/dL (ref 12.0–15.0)
Immature Granulocytes: 0 %
Lymphocytes Relative: 20 %
Lymphs Abs: 1.9 10*3/uL (ref 0.7–4.0)
MCH: 26.5 pg (ref 26.0–34.0)
MCHC: 30.6 g/dL (ref 30.0–36.0)
MCV: 86.7 fL (ref 80.0–100.0)
Monocytes Absolute: 0.5 10*3/uL (ref 0.1–1.0)
Monocytes Relative: 6 %
Neutro Abs: 6.8 10*3/uL (ref 1.7–7.7)
Neutrophils Relative %: 72 %
Platelets: 215 10*3/uL (ref 150–400)
RBC: 4.75 MIL/uL (ref 3.87–5.11)
RDW: 13.1 % (ref 11.5–15.5)
WBC: 9.5 10*3/uL (ref 4.0–10.5)
nRBC: 0 % (ref 0.0–0.2)

## 2018-12-26 LAB — COMPREHENSIVE METABOLIC PANEL
ALT: 16 U/L (ref 0–44)
AST: 16 U/L (ref 15–41)
Albumin: 3.8 g/dL (ref 3.5–5.0)
Alkaline Phosphatase: 50 U/L (ref 38–126)
Anion gap: 8 (ref 5–15)
BUN: 9 mg/dL (ref 6–20)
CALCIUM: 8.8 mg/dL — AB (ref 8.9–10.3)
CO2: 29 mmol/L (ref 22–32)
Chloride: 105 mmol/L (ref 98–111)
Creatinine, Ser: 0.65 mg/dL (ref 0.44–1.00)
GFR calc Af Amer: >60 mL/min (ref >60)
GFR calc non Af Amer: >60 mL/min (ref >60)
GLUCOSE: 104 mg/dL — AB (ref 70–99)
Potassium: 3.4 mmol/L — ABNORMAL LOW (ref 3.5–5.1)
Sodium: 142 mmol/L (ref 135–145)
Total Bilirubin: 0.3 mg/dL (ref 0.3–1.2)
Total Protein: 7.1 g/dL (ref 6.5–8.1)

## 2018-12-26 LAB — BRAIN NATRIURETIC PEPTIDE: B NATRIURETIC PEPTIDE 5: 38 pg/mL (ref 0.0–100.0)

## 2018-12-26 LAB — TROPONIN I: Troponin I: <0.03 ng/mL (ref <0.03)

## 2018-12-26 MED ORDER — ONDANSETRON HCL 4 MG/2ML IJ SOLN
4.0000 mg | INTRAMUSCULAR | Status: AC
  Administered 2018-12-26: 4 mg via INTRAVENOUS
  Filled 2018-12-26: qty 2

## 2018-12-26 MED ORDER — PREDNISONE 20 MG PO TABS: 60.0000 mg | ORAL_TABLET | Freq: Every day | ORAL | 0 refills | Status: DC

## 2018-12-26 MED ORDER — METHYLPREDNISOLONE SODIUM SUCC 125 MG IJ SOLR
125.0000 mg | Freq: Once | INTRAMUSCULAR | Status: AC
  Administered 2018-12-26: 125 mg via INTRAVENOUS
  Filled 2018-12-26: qty 2

## 2018-12-26 MED ORDER — IPRATROPIUM-ALBUTEROL 0.5-2.5 (3) MG/3ML IN SOLN
3.0000 mL | Freq: Once | RESPIRATORY_TRACT | Status: AC
  Administered 2018-12-26: 3 mL via RESPIRATORY_TRACT

## 2018-12-26 MED ORDER — IPRATROPIUM-ALBUTEROL 0.5-2.5 (3) MG/3ML IN SOLN
3.0000 mL | Freq: Once | RESPIRATORY_TRACT | Status: AC
  Administered 2018-12-26: 3 mL via RESPIRATORY_TRACT
  Filled 2018-12-26: qty 3

## 2018-12-26 MED ORDER — ALBUTEROL SULFATE HFA 108 (90 BASE) MCG/ACT IN AERS: INHALATION_SPRAY | RESPIRATORY_TRACT | 1 refills | Status: AC

## 2018-12-26 NOTE — Discharge Instructions (Signed)
As we discussed, your medical work-up was reassuring today.  Since you are feeling better after your breathing treatments, you prefer to go home.  However if your symptoms get any worse or you develop new symptoms that concern you, please return immediately to the emergency department.

## 2018-12-26 NOTE — ED Provider Notes (Signed)
[facility] Emergency Department Provider Note  ____________________________________________   First MD Initiated Contact with Patient 12/26/18 0240     (approximate)  I have reviewed the triage vital signs and the nursing notes.   HISTORY  Chief Complaint Wheezing    HPI Peggy Villa is a 53 y.o. female with medical history as listed below (although she says she does "not really" have asthma but she has used breathing treatments in the past) and who stopped smoking about 6 months ago.  She presents tonight  by private vehicle for evaluation of shortness of breath.  She reports that she had relatively acute onset cough and shortness of breath tonight that is worse when she lies flat.  She tried using her albuterol inhaler but it did not work.  She has had a little bit of a cough for a couple of days but the breathing became much more noticeable tonight and then became severe.  Nothing in particular helps other than sitting up and avoiding a reclining position.  She denies chest pain, abdominal pain, nausea, vomiting.  She denies fever/chills, sore throat, and says she has not done any recent travel and has not been in contact with people who have been traveling to high risk of 19 areas or have known COVID-19 exposures.  She has not noticed any swelling in her legs and has no history of blood clots in her legs or her lungs.  She has no history of heart disease and does not have a diagnosis of CHF although she does take hydrochlorothiazide which she describes as a fluid pill.        Past Medical History:  Diagnosis Date   Asthma    Hypertension    Obesity     There are no active problems to display for this patient.   Past Surgical History:  Procedure Laterality Date   BREAST REDUCTION SURGERY     REDUCTION MAMMAPLASTY Bilateral 2006    Prior to Admission medications   Medication Sig Start Date End Date Taking? Authorizing Provider    albuterol (PROVENTIL HFA;VENTOLIN HFA) 108 (90 Base) MCG/ACT inhaler Inhale 2-4 puffs by mouth every 4 hours as needed for wheezing, cough, and/or shortness of breath 12/26/18   Hinda Kehr, MD  amLODIPine Besylate (NORVASC PO) amlodipine    [provider]  cyclobenzaprine (FLEXERIL) 10 MG tablet Take 1 tablet (10 mg total) by mouth 3 (three) times daily as needed for muscle spasms. 12/17/18   Norval Gable, MD  dextromethorphan-guaiFENesin [facility] DM) 30-600 MG 12hr tablet Take 1 tablet by mouth at bedtime.    [provider]  escitalopram (LEXAPRO) 10 MG tablet Take 10 mg by mouth daily.    [provider]  hydroCHLOROthiazide (MICROZIDE PO) hydrochlorothiazide    [provider]  ibuprofen (ADVIL,MOTRIN) 800 MG tablet Take 1 tablet (800 mg total) by mouth 3 (three) times daily. 12/17/18   Norval Gable, MD  predniSONE (DELTASONE) 20 MG tablet Take 3 tablets (60 mg total) by mouth daily. 12/26/18   Hinda Kehr, MD    Allergies Milk-related compounds; Peanut-containing drug products; and Penicillins  Family History  Problem Relation Age of Onset   Lupus Mother    Cancer Father    Breast cancer Neg Hx     Social History Social History   Tobacco Use   Smoking status: Former Smoker    Packs/day: 0.25   Smokeless tobacco: Never Used  Substance Use Topics   Alcohol use: Yes  Comment: socially   Drug use: Not Currently    Review of Systems Constitutional: No fever/chills Eyes: No visual changes. ENT: No sore throat. Cardiovascular: Denies chest pain. Respiratory: Cough for a couple of days with severe shortness of breath, wheezing, and orthopnea starting tonight Gastrointestinal: No abdominal pain.  No nausea, no vomiting.  No diarrhea.  No constipation. Genitourinary: Negative for dysuria. Musculoskeletal: Negative for neck pain.  Negative for back pain. Integumentary: Negative for rash. Neurological: Negative for headaches,  focal weakness or numbness.   ____________________________________________   PHYSICAL EXAM:  VITAL SIGNS: ED Triage Vitals  Enc Vitals Group     BP 12/26/18 0231 (!) 162/100     Pulse Rate 12/26/18 0231 92     Resp 12/26/18 0231 20     Temp 12/26/18 0231 98.3 F (36.8 C)     Temp Source 12/26/18 0231 Oral     SpO2 12/26/18 0231 97 %     Weight 12/26/18 0230 133.8 kg (295 lb)     Height 12/26/18 0230 1.702 m (5\' 7" )     Head Circumference --      Peak Flow --      Pain Score --      Pain Loc --      Pain Edu? --      Excl. in [place]? --     Constitutional: Alert and oriented.  Patient is generally well-appearing but does appear to be in mild respiratory distress. Eyes: Conjunctivae are normal.  Head: Atraumatic. Nose: No congestion/rhinnorhea. Mouth/Throat: Mucous membranes are moist.  Oropharynx non-erythematous. Neck: No stridor.  No meningeal signs.   Cardiovascular: Normal rate, regular rhythm. Good peripheral circulation. Grossly normal heart sounds. Respiratory: Slightly increased respiratory effort with a little bit of accessory muscle usage but no intercostal retractions.  She has an occasional thick sounding cough.  She has wheezing throughout lung fields although there are significantly diminished breath sounds in the left lung mid-lung and base.  Wheezing is most notable in the expiratory phase.  She is sitting up straight and almost leaning over but not tripodding.  She is speaking clearly and in full sentences. Gastrointestinal: Soft and nontender. No distention.  Musculoskeletal: No lower extremity tenderness nor edema. No gross deformities of extremities. Neurologic:  Normal speech and language. No gross focal neurologic deficits are appreciated.  Skin:  Skin is warm, dry and intact. No rash noted. Psychiatric: Mood and affect are normal. Speech and behavior are normal.  ____________________________________________   LABS (all labs ordered are listed, but only  abnormal results are displayed)  Labs Reviewed  COMPREHENSIVE METABOLIC PANEL - Abnormal; Notable for the following components:      Result Value   Potassium 3.4 (*)    Glucose, Bld 104 (*)    Calcium 8.8 (*)    All other components within normal limits  BRAIN NATRIURETIC PEPTIDE  TROPONIN I  CBC WITH DIFFERENTIAL/PLATELET   ____________________________________________  EKG  ED ECG REPORT I, Hinda Kehr, the attending physician, personally viewed and interpreted this ECG.  Date: 12/26/2018 EKG Time: 2:55 AM Rate: 87 Rhythm: normal sinus rhythm QRS Axis: normal Intervals: normal ST/T Wave abnormalities: Non-specific ST segment / T-wave changes, but no clear evidence of acute ischemia. Narrative Interpretation: no definitive evidence of acute ischemia; does not meet STEMI criteria.   ____________________________________________  RADIOLOGY I, Hinda Kehr, personally viewed and evaluated these images (plain radiographs) as part of my medical decision making, as well as reviewing the written report by the radiologist.  ED MD interpretation: Chronic and severe left hemidiaphragm elevation but without any superimposed cardiopulmonary abnormality.  Official radiology report(s): Dg Chest Portable 1 View  Result Date: 12/26/2018 CLINICAL DATA:  53 year old female with cough wheezing and shortness of breath. EXAM: PORTABLE CHEST 1 VIEW COMPARISON:  Chest radiographs 11/17/2015 and earlier. FINDINGS: Portable AP upright view at 0259 hours. Chronic but increased elevation of the left hemidiaphragm suggesting left phrenic nerve palsy. Progressed left lung base and hilar atelectasis compared to 2017. Otherwise when allowing for portable technique the lungs appear clear. Stable cardiac size and mediastinal contours. Visualized tracheal air column is within normal limits. Visible bowel-gas pattern within normal limits. IMPRESSION: 1. 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 2. No superimposed acute cardiopulmonary abnormality identified. Electronically Signed   By: Genevie Ann M.D.   On: 12/26/2018 03:13    ____________________________________________   PROCEDURES   Procedure(s) performed (including Critical Care):  Procedures   ____________________________________________   INITIAL IMPRESSION / MDM / [place] / ED COURSE  As part of my medical decision making, I reviewed the following data within the [facility] notes reviewed and incorporated, Labs reviewed , EKG interpreted , Old chart reviewed, Radiograph reviewed  and Notes from prior ED visits         Differential diagnosis includes, but is not limited to, reactive airways disease exacerbation (asthma and/or COPD), pneumonia, CHF exacerbation with or without pulmonary edema, ACS, PE, much less likely COVID-19.  She is low risk for COVID-19 based on her history and her presenting symptoms.  Based on the acute onset of the symptoms tonight including the orthopnea, I wonder if she is having a volume overload situation that is leading to CHF exacerbation.  Her blood pressure is significantly elevated over her reported baseline, and while this may be the result of her symptoms and not the cause, it is possible that she is experiencing some flash pulmonary edema.  She is not in severe distress but does sound like she may benefit from a breathing treatment.  Given the current concerns for aerosolization of any pathogens from any patient, I will have the nurses obtain blood work and EKG and chest x-ray and then we will teach the patient how to administer her own breathing treatment while awaiting the results of the labs to see if it helps her feel better.  She understands and agrees with the plan.  Her vital signs are otherwise stable with an SPO2 of 97% on triage although she was at about 94% when I evaluated  her.  Clinical Course as of Dec 25 412  Wed Dec 26, 2018  0311 WBC: 9.5 [CF]  0341 Still possible, but less likely to represent gradually worsening heart failure over a couple of days.  B Natriuretic Peptide: 38.0 [CF]  0341 Troponin I: <0.03 [CF]  0341 Severely elevated left hemidiaphragm which is consistent with prior but worse since the last image on record from 3 years ago.  No evidence of obvious interstitial or pulmonary edema, pleural effusion, nor pneumonia.  DG Chest Portable 1 View [CF]  0406 I reassessed the patient after her breathing treatments.  Her lung sounds are more clear although she still has some expiratory wheezing.  She says that she feels much better.  She is now nauseated but she says it is because of the breathing treatments open up her lungs and now she is coughing up  some phlegm.  I am giving her Zofran 4 mg IV and Solu-Medrol 125 mg IV.  There is no sign of CHF exacerbation at this point.  The patient says she feels better even though she is still wheezing.  I offered to keep her and even admit her to the hospital for further evaluation and treatment, but she wants to go home.  She has a capacity to make her own decisions I think it is appropriate.  I will discharge her with a prescription for prednisone as well as a new prescription for albuterol MDI.  I gave her strict return precautions if her symptoms worsen and she understands and agrees with the plan.   [CF]    Clinical Course User Index [CF] Hinda Kehr, MD    ____________________________________________  FINAL CLINICAL IMPRESSION(S) / ED DIAGNOSES  Final diagnoses:  Moderate asthma with exacerbation, unspecified whether persistent  Shortness of breath     MEDICATIONS GIVEN DURING THIS VISIT:  Medications  ipratropium-albuterol (DUONEB) 0.5-2.5 (3) MG/3ML nebulizer solution 3 mL (3 mLs Nebulization Given 12/26/18 0311)  ipratropium-albuterol (DUONEB) 0.5-2.5 (3) MG/3ML nebulizer solution 3 mL (3  mLs Nebulization Given 12/26/18 0311)  ipratropium-albuterol (DUONEB) 0.5-2.5 (3) MG/3ML nebulizer solution 3 mL (3 mLs Nebulization Given 12/26/18 0311)  methylPREDNISolone sodium succinate (SOLU-MEDROL) 125 mg/2 mL injection 125 mg (125 mg Intravenous Given 12/26/18 0408)  ondansetron (ZOFRAN) injection 4 mg (4 mg Intravenous Given 12/26/18 0408)     ED Discharge Orders         Ordered    predniSONE (DELTASONE) 20 MG tablet  Daily     12/26/18 0409    albuterol (PROVENTIL HFA;VENTOLIN HFA) 108 (90 Base) MCG/ACT inhaler     12/26/18 0409           Note:  This document was prepared using Dragon voice recognition software and may include unintentional dictation errors.   Hinda Kehr, MD 12/26/18 [phone]

## 2018-12-26 NOTE — ED Triage Notes (Addendum)
Pt to triage via w/c; st awoke tonight with onset cough & wheezing; denies any recent illness; denies pain; st hx asthma, used inhaler without relief

## 2018-12-26 NOTE — ED Notes (Signed)
Pt stated that for the past 3 days she has had a productive cough but tonight was not able to. Pt has a hx of asthma and she stated that she used her inhaler prior to coming to the ED. Denies any fever, body aches or chills.

## 2018-12-26 NOTE — ED Notes (Signed)
Pt stated that she did feel better and that she feels like she can relax her head back and not become SOB.

## 2019-03-19 ENCOUNTER — Other Ambulatory Visit: Payer: 59

## 2019-03-19 ENCOUNTER — Telehealth: Payer: Self-pay

## 2019-03-19 DIAGNOSIS — Z20822 Contact with and (suspected) exposure to covid-19: Secondary | ICD-10-CM

## 2019-03-19 NOTE — Addendum Note (Signed)
Addended by: Matilde Sprang on: 03/19/2019 11:05 AM   Modules accepted: Orders

## 2019-03-19 NOTE — Telephone Encounter (Signed)
[place], [place], requests COVID 19 testing for symptoms. Message left for pt. To call and schedule. [facility] - phone # [phone], fax # [phone].

## 2019-03-19 NOTE — Telephone Encounter (Signed)
Patient returned call and was disconnected before the transfer. I called her back to schedule. Appointment scheduled for today at 32 at [facility], advised of location and to wear a mask for everyone in the vehicle, she verbalized understanding. Order placed.

## 2019-03-21 LAB — NOVEL CORONAVIRUS, NAA: SARS-CoV-2, NAA: NOT DETECTED

## 2019-07-16 ENCOUNTER — Other Ambulatory Visit: Payer: Self-pay

## 2019-07-16 ENCOUNTER — Ambulatory Visit
Admission: EM | Admit: 2019-07-16 | Discharge: 2019-07-16 | Disposition: A | Discharge status: Home / Self Care | Payer: 59 | Attending: Urgent Care | Admitting: Urgent Care

## 2019-07-16 DIAGNOSIS — M25511 Pain in right shoulder: Secondary | ICD-10-CM

## 2019-07-16 DIAGNOSIS — M62838 Other muscle spasm: Secondary | ICD-10-CM

## 2019-07-16 MED ORDER — METHOCARBAMOL 750 MG PO TABS: 750.0000 mg | ORAL_TABLET | Freq: Two times a day (BID) | ORAL | 0 refills | Status: DC | PRN

## 2019-07-16 MED ORDER — KETOROLAC TROMETHAMINE 10 MG PO TABS: 10.0000 mg | ORAL_TABLET | Freq: Three times a day (TID) | ORAL | 0 refills | Status: DC | PRN

## 2019-07-16 NOTE — ED Triage Notes (Signed)
As per patient back pain onset 3 weeks can't sleep due to pain, today is worst OTC not improving her back pain.

## 2019-07-16 NOTE — Discharge Instructions (Signed)
It was very nice seeing you today in clinic. Thank you for entrusting me with your care.   As discussed, your pain seems to be musculoskeletal in nature. Plans for treating you are as follows:  Please utilize the medications that we discussed. Your prescriptions have been called in to your pharmacy.  May use Tylenol and/or Ibuprofen as needed for pain. Avoid overdoing it, but you need to make efforts to remain active as tolerated.  Avoiding activity all together can make your pain worse. Apply moist heat to help with your pain.  Heat should be applied for 10-15 minutes at a time at least 3-4 times a day.  Make arrangements to follow up with your regular doctor in 1 week for re-evaluation. If your symptoms/condition worsens, please seek follow up care either here or in the ER. Please remember, our [place] providers are "right here with you" when you need [place].   Again, it was my pleasure to take care of you today. Thank you for choosing our clinic. I hope that you start to feel better quickly.   Honor Loh, MSN, APRN, FNP-C, CEN Advanced Practice Provider [facility]

## 2019-07-16 NOTE — ED Provider Notes (Signed)
[place], [place]   Name: Peggy Villa DOB: Feb 08, 1966 MRN: [id number] CSN: [id number] PCP: Sharyne Peach, MD  Arrival date and time:  07/16/19 0805  Chief Complaint:  Back Pain   NOTE: Prior to seeing the patient today, I have reviewed the triage nursing documentation and vital signs. Clinical staff has updated patient's PMH/PSHx, current medication list, and drug allergies/intolerances to ensure comprehensive history available to assist in medical decision making.   History:   HPI: Peggy Villa is a 52 y.o. female who presents today with complaints of pain to the RIGHT side of her neck and shoulder.  Patient denies injury.  She notes that it is difficult for her to go through normal range of motion exercises.  Patient advising that pain has been present to some degree for the last 3 months.  Recently did a telehealth visit with [facility] and was prescribed ibuprofen.  Patient has had similar complaints in the past and was diagnosed with muscle spasms; current symptoms are similar.  Patient has been taking ibuprofen and using over-the-counter muscle rubs, which have helped her symptoms some.  Patient denies any weakness in her arm or distal paresthesias.  Patient has a physically demanding job and feels like she is unable to work today due to her pain.  Patient presents with elevated blood pressure in the setting of known hypertension.  Patient attributes elevated blood pressure to her current pain level which she rates 8/10.  Past Medical History:  Diagnosis Date  . Asthma   . Hypertension   . Obesity     Past Surgical History:  Procedure Laterality Date  . BREAST REDUCTION SURGERY    . REDUCTION MAMMAPLASTY Bilateral 2006    Family History  Problem Relation Age of Onset  . Lupus Mother   . Cancer Father   . Breast cancer Neg Hx     Social History   Tobacco Use  . Smoking status: Former Smoker    Packs/day: 0.25  . Smokeless tobacco: Never Used  Substance Use Topics   . Alcohol use: Yes    Comment: socially  . Drug use: Not Currently    There are no active problems to display for this patient.   Home Medications:    Current Meds  Medication Sig  . albuterol (PROVENTIL HFA;VENTOLIN HFA) 108 (90 Base) MCG/ACT inhaler Inhale 2-4 puffs by mouth every 4 hours as needed for wheezing, cough, and/or shortness of breath  . amLODIPine Besylate (NORVASC PO) amlodipine  . escitalopram (LEXAPRO) 10 MG tablet Take 10 mg by mouth daily.  . hydroCHLOROthiazide (MICROZIDE PO) hydrochlorothiazide  . [DISCONTINUED] ibuprofen (ADVIL,MOTRIN) 800 MG tablet Take 1 tablet (800 mg total) by mouth 3 (three) times daily.  . [DISCONTINUED] predniSONE (DELTASONE) 20 MG tablet Take 3 tablets (60 mg total) by mouth daily.    Allergies:   Penicillin g, Milk-related compounds, Other, Peanut-containing drug products, and Penicillins  Review of Systems (ROS): Review of Systems  Constitutional: Negative for chills and fever.  Respiratory: Negative for cough and shortness of breath.   Cardiovascular: Negative for chest pain and palpitations.  Musculoskeletal: Positive for neck pain and neck stiffness.       RIGHT shoulder pain  Neurological: Negative for dizziness, weakness and numbness.  All other systems reviewed and are negative.    Vital Signs: Today's Vitals   07/16/19 0814 07/16/19 0815 07/16/19 0818 07/16/19 0850  BP:   (!) 164/123 (!) 170/110  Pulse:   74   Resp:  16   Temp:   98.5 F (36.9 C)   TempSrc:   Oral   SpO2:   100%   Weight:  285 lb (129.3 kg)    Height:  5' 7.5" (1.715 m)    PainSc: 8        Physical Exam: Physical Exam  Constitutional: She is oriented to person, place, and time and well-developed, well-nourished, and in no distress.  HENT:  Head: Normocephalic and atraumatic.  Mouth/Throat: Mucous membranes are normal.  Eyes: Pupils are equal, round, and reactive to light. EOM are normal.  Neck: Normal range of motion. Neck supple.  Muscular tenderness present. No tracheal deviation, no edema and no erythema present.  Cardiovascular: Normal rate, regular rhythm, normal heart sounds and intact distal pulses. Exam reveals no gallop and no friction rub.  No murmur heard. Pulmonary/Chest: Effort normal and breath sounds normal. No respiratory distress. She has no wheezes. She has no rales.  Musculoskeletal:     Right shoulder: She exhibits tenderness and spasm. She exhibits no swelling, no effusion, no crepitus, no deformity, normal pulse and normal strength.  Neurological: She is alert and oriented to person, place, and time. Gait normal.  Skin: Skin is warm and dry. No rash noted.  Psychiatric: Mood, memory, affect and judgment normal.  Nursing note and vitals reviewed.   Urgent Care Treatments / Results:   LABS: PLEASE NOTE: all labs that were ordered this encounter are listed, however only abnormal results are displayed. Labs Reviewed - No data to display  EKG: -None  RADIOLOGY: No results found.  PROCEDURES: Procedures  MEDICATIONS RECEIVED THIS VISIT: Medications - No data to display  PERTINENT CLINICAL COURSE NOTES/UPDATES:   Initial Impression / Assessment and Plan / Urgent Care Course:  Pertinent labs & imaging results that were available during my care of the patient were personally reviewed by me and considered in my medical decision making (see lab/imaging section of note for values and interpretations).  Villa CHAP is a 53 y.o. female who presents to [facility] today with complaints of neck and shoulder pain.   Patient is well appearing overall in clinic today. She does not appear to be in any acute distress. Presenting symptoms (see HPI) and exam as documented above. Pain worsening over the course of the last 3 weeks. Patient has been seen by telehealth provider and was advised to take IBU. She has been using dry heat and muscle rubs without significant relief. Blood pressure is  elevated secondary to pain. Will change NSAID to ketorolac and add skeletal muscle relaxer (methocarbamol). She was educated on complimentary modalities to help with her pain. Patient encouraged to rest and avoid lifting for the next few days. She will likely find added benefit of applying moist heat TID for at least 10-15 minutes at a time; written information provided on today's AVS.   Current clinical condition warrants patient being out of work in order to recover from her current injury/illness. She was provided with the appropriate documentation to provide to her place of employment that will allow for her to RTW on 07/18/2019 with no restrictions.   Discussed follow up with primary care physician in 1 week for re-evaluation. I have reviewed the follow up and strict return precautions for any new or worsening symptoms. Patient is aware of symptoms that would be deemed urgent/emergent, and would thus require further evaluation either here or in the emergency department. At the time of discharge, she verbalized understanding and consent with  the discharge plan as it was reviewed with her. All questions were fielded by provider and/or clinic staff prior to patient discharge.    Final Clinical Impressions / Urgent Care Diagnoses:   Final diagnoses:  Muscle spasms of neck  Acute pain of right shoulder    New Prescriptions:  [place] Controlled Substance Registry consulted? Not Applicable  Meds ordered this encounter  Medications  . ketorolac (TORADOL) 10 MG tablet    Sig: Take 1 tablet (10 mg total) by mouth every 8 (eight) hours as needed.    Dispense:  21 tablet    Refill:  0  . methocarbamol (ROBAXIN) 750 MG tablet    Sig: Take 1 tablet (750 mg total) by mouth 2 (two) times daily as needed for muscle spasms.    Dispense:  14 tablet    Refill:  0    Recommended Follow up Care:  Patient encouraged to follow up with the following provider within the specified time frame, or sooner as dictated  by the severity of her symptoms. As always, she was instructed that for any urgent/emergent care needs, she should seek care either here or in the emergency department for more immediate evaluation.  Follow-up Information    Sharyne Peach, MD In 1 week.   Specialty: Family Medicine Why: General reassessment of symptoms if not improving Contact information: [address] [place]  [id number] [phone]         NOTE: This note was prepared using Dragon dictation software along with smaller phrase technology. Despite my best ability to proofread, there is the potential that transcriptional errors may still occur from this process, and are completely unintentional.    Karen Kitchens, NP 07/16/19 1037

## 2019-12-27 ENCOUNTER — Ambulatory Visit: Payer: Self-pay | Attending: Internal Medicine

## 2019-12-27 DIAGNOSIS — Z23 Encounter for immunization: Secondary | ICD-10-CM

## 2019-12-27 NOTE — Progress Notes (Signed)
   Covid-19 Vaccination Clinic  Name:  Peggy Villa    MRN: [id number] DOB: [birth year]  12/27/2019  Ms. Peggy Villa was observed post Covid-19 immunization for 15 minutes without incident. She was provided with Vaccine Information Sheet and instruction to access the V-Safe system.   Ms. Peggy Villa was instructed to call 911 with any severe reactions post vaccine: Marland Kitchen Difficulty breathing  . Swelling of face and throat  . A fast heartbeat  . A bad rash all over body  . Dizziness and weakness

## 2020-01-24 ENCOUNTER — Ambulatory Visit: Payer: Self-pay | Attending: Internal Medicine

## 2020-01-24 DIAGNOSIS — Z23 Encounter for immunization: Secondary | ICD-10-CM

## 2020-01-24 NOTE — Progress Notes (Signed)
   Covid-19 Vaccination Clinic  Name:  Peggy Villa    MRN: [id number] DOB: [birth year]  01/24/2020  [name] was observed post Covid-19 immunization for 15 minutes without incident. She was provided with Vaccine Information Sheet and instruction to access the V-Safe system.   [name] was instructed to call 911 with any severe reactions post vaccine: Marland Kitchen Difficulty breathing  . Swelling of face and throat  . A fast heartbeat  . A bad rash all over body  . Dizziness and weakness   Immunizations Administered    Name Date Dose VIS Date Route   Moderna COVID-19 Vaccine 01/24/2020  2:12 PM 0.5 mL 09/2019 Intramuscular   Manufacturer: Moderna   Lot: [id number]   [id number]

## 2020-02-28 ENCOUNTER — Ambulatory Visit
Admission: EM | Admit: 2020-02-28 | Discharge: 2020-02-28 | Disposition: A | Discharge status: Home / Self Care | Payer: BC Managed Care – PPO | Attending: Family Medicine | Admitting: Family Medicine

## 2020-02-28 ENCOUNTER — Ambulatory Visit (INDEPENDENT_AMBULATORY_CARE_PROVIDER_SITE_OTHER): Payer: BC Managed Care – PPO

## 2020-02-28 ENCOUNTER — Encounter: Payer: Self-pay | Admitting: Emergency Medicine

## 2020-02-28 ENCOUNTER — Other Ambulatory Visit: Payer: Self-pay

## 2020-02-28 DIAGNOSIS — J986 Disorders of diaphragm: Secondary | ICD-10-CM

## 2020-02-28 DIAGNOSIS — D1730 Benign lipomatous neoplasm of skin and subcutaneous tissue of unspecified sites: Secondary | ICD-10-CM

## 2020-02-28 MED ORDER — MELOXICAM 15 MG PO TABS: 15.0000 mg | ORAL_TABLET | Freq: Every day | ORAL | 0 refills | Status: DC

## 2020-02-28 NOTE — ED Triage Notes (Signed)
Patient c/o left shoulder pain that started yesterday. Patient reports pain when she breaths.  Patient denies pain with movement.  Patient denies chest pain or SOB.

## 2020-02-28 NOTE — Discharge Instructions (Signed)
Follow-up with your primary care physician for evaluation of the lipoma and also your shortness of breath

## 2020-02-28 NOTE — ED Provider Notes (Signed)
[facility]    CSN: [id number] Arrival date & time: 02/28/20  1126      History   Chief Complaint Chief Complaint  Patient presents with  . Shoulder Pain    left    HPI Peggy Villa is a 54 y.o. female.   HPI  54 year old female presents with left sided neck pain exacerbated by breathing.  She denies any neck pain with movement.  No chest pain but has had  shortness of breath.  Scribed as" takes my breath away" when she takes in a deep breath.  Also noticed a soft lump supraclavicularly is not painful.  It is very soft and movable.  She denies any history of injury.  She does have a history of smoking but quit about 2 years ago.  Had no cough.  Not noticed any recent weight loss.       Past Medical History:  Diagnosis Date  . Asthma   . Hypertension   . Obesity     There are no problems to display for this patient.   Past Surgical History:  Procedure Laterality Date  . BREAST REDUCTION SURGERY    . REDUCTION MAMMAPLASTY Bilateral 2006    OB History   No obstetric history on file.      Home Medications    Prior to Admission medications   Medication Sig Start Date End Date Taking? Authorizing Provider  amLODIPine Besylate (NORVASC PO) amlodipine   Yes [provider]  escitalopram (LEXAPRO) 10 MG tablet Take 10 mg by mouth daily.   Yes [provider]  albuterol (PROVENTIL HFA;VENTOLIN HFA) 108 (90 Base) MCG/ACT inhaler Inhale 2-4 puffs by mouth every 4 hours as needed for wheezing, cough, and/or shortness of breath 12/26/18   Hinda Kehr, MD  meloxicam (MOBIC) 15 MG tablet Take 1 tablet (15 mg total) by mouth daily. Take with food. 02/28/20   Lorin Picket, PA-C    Family History Family History  Problem Relation Age of Onset  . Lupus Mother   . Cancer Father   . Breast cancer Neg Hx     Social History Social History   Tobacco Use  . Smoking status: Former Smoker    Packs/day: 0.25  . Smokeless tobacco:  Never Used  Substance Use Topics  . Alcohol use: Yes    Comment: socially  . Drug use: Not Currently     Allergies   Penicillin g, Milk-related compounds, Other, Peanut-containing drug products, and Penicillins   Review of Systems Review of Systems  Constitutional: Positive for activity change. Negative for appetite change, chills, diaphoresis, fatigue and fever.  Musculoskeletal: Negative for neck pain and neck stiffness.  All other systems reviewed and are negative.    Physical Exam Triage Vital Signs ED Triage Vitals  Enc Vitals Group     BP 02/28/20 1139 (!) 152/83     Pulse Rate 02/28/20 1139 84     Resp 02/28/20 1139 16     Temp 02/28/20 1139 98.3 F (36.8 C)     Temp Source 02/28/20 1139 Oral     SpO2 02/28/20 1139 96 %     Weight 02/28/20 1138 285 lb (129.3 kg)     Height 02/28/20 1138 5\' 7"  (1.702 m)     Head Circumference --      Peak Flow --      Pain Score 02/28/20 1138 7     Pain Loc --      Pain Edu? --  Excl. in GC? --    No data found.  Updated Vital Signs BP (!) 152/83 (BP Location: Left Arm)   Pulse 84   Temp 98.3 F (36.8 C) (Oral)   Resp 16   Ht 5\' 7"  (1.702 m)   Wt 285 lb (129.3 kg)   LMP 02/01/2016   SpO2 96%   BMI 44.64 kg/m   Visual Acuity Right Eye Distance:   Left Eye Distance:   Bilateral Distance:    Right Eye Near:   Left Eye Near:    Bilateral Near:     Physical Exam Vitals and nursing note reviewed.  Constitutional:      General: She is not in acute distress.    Appearance: Normal appearance. She is not ill-appearing or toxic-appearing.  HENT:     Head: Normocephalic and atraumatic.  Eyes:     Conjunctiva/sclera: Conjunctivae normal.  Cardiovascular:     Rate and Rhythm: Normal rate and regular rhythm.     Heart sounds: Normal heart sounds.  Pulmonary:     Effort: Pulmonary effort is normal.     Breath sounds: Normal breath sounds.  Musculoskeletal:        General: Normal range of motion.     Cervical  back: Normal range of motion and neck supple.  Skin:    General: Skin is warm and dry.     Findings: Lesion present.     Comments: Examination of the supraclavicular fossa on the left shows a soft swelling well delineated nontender fluctuant appearing that measures 3 cm x 1-1/2 cm.  No induration.  Neurological:     General: No focal deficit present.     Mental Status: She is alert and oriented to person, place, and time.  Psychiatric:        Mood and Affect: Mood normal.        Behavior: Behavior normal.        Thought Content: Thought content normal.        Judgment: Judgment normal.      UC Treatments / Results  Labs (all labs ordered are listed, but only abnormal results are displayed) Labs Reviewed - No data to display  EKG ED ECG REPORT   Date: 02/28/2020  EKG Time: 6:32 PM  Rate 79  Rhythm: Sinus rhythm with a sinus arrhythmia.   Axis: Normal  Intervals: No conduction defects seen.  ST&T Change: No significant ST changes present.  Narrative Interpretation: Sinus rhythm with sinus arrhythmia no acute changes           Radiology DG Chest 2 View  Result Date: 02/28/2020 CLINICAL DATA:  54 year old female with history of shortness of breath. EXAM: CHEST - 2 VIEW COMPARISON:  Chest x-ray 12/26/2018. FINDINGS: Severe elevation of the left hemidiaphragm, similar to the prior study. No acute consolidative airspace disease. No pleural effusions. No evidence of pulmonary edema. No pneumothorax. Heart size is normal. Mediastinal contours are slightly shifted to the right related to the patient's elevated left hemidiaphragm. IMPRESSION: 1. No radiographic evidence of acute cardiopulmonary disease. 2. Severe elevation of the left hemidiaphragm redemonstrated. Electronically Signed   By: Vinnie Langton M.D.   On: 02/28/2020 12:56    Procedures Procedures (including critical care time)  Medications Ordered in UC Medications - No data to display  Initial Impression /  Assessment and Plan / UC Course  I have reviewed the triage vital signs and the nursing notes.  Pertinent labs & imaging results that were available during my  care of the patient were reviewed by me and considered in my medical decision making (see chart for details).  54 year-old female presents with Left-sided neck pain exacerbated by deep breathing.. Feels as if it takes her breath away.  Noticed a soft lump the supraclavicularly that she first noticed on Wednesday.  Consistent with a lipoma.  Viewed the EKG was interpreted by myself.  I reviewed the chest x-ray with the patient and told her that the there was no acute pulmonary process.  He does have a chronically elevated left hemidiaphragm which is noted in her previous medical notes.  I have reassured her that the lump that she is describing and is present is likely a lipoma.  I recommended that she follow-up with her primary care physician for both of these problems.  Did prescribe Mobic for control since she states that previous use of Aleve did temporarily relieve her symptoms.  Final Clinical Impressions(s) / UC Diagnoses   Final diagnoses:  Chronically elevated hemidiaphragm  Lipoma of skin and subcutaneous tissue (excluding face)     Discharge Instructions     Follow-up with your primary care physician for evaluation of the lipoma and also your shortness of breath    ED Prescriptions    Medication Sig Dispense Auth. Provider   meloxicam (MOBIC) 15 MG tablet Take 1 tablet (15 mg total) by mouth daily. Take with food. 14 tablet Lorin Picket, PA-C     PDMP not reviewed this encounter.   Lorin Picket, PA-C 02/28/20 1832

## 2020-06-24 ENCOUNTER — Emergency Department: Payer: BC Managed Care – PPO

## 2020-06-24 ENCOUNTER — Emergency Department
Admission: EM | Admit: 2020-06-24 | Discharge: 2020-06-24 | Disposition: A | Discharge status: Home / Self Care | Payer: BC Managed Care – PPO | Attending: Student in an Organized Health Care Education/Training Program | Admitting: Student in an Organized Health Care Education/Training Program

## 2020-06-24 ENCOUNTER — Encounter: Payer: Self-pay | Admitting: Emergency Medicine

## 2020-06-24 ENCOUNTER — Other Ambulatory Visit: Payer: Self-pay

## 2020-06-24 DIAGNOSIS — Z87891 Personal history of nicotine dependence: Secondary | ICD-10-CM | POA: Insufficient documentation

## 2020-06-24 DIAGNOSIS — I10 Essential (primary) hypertension: Secondary | ICD-10-CM | POA: Insufficient documentation

## 2020-06-24 DIAGNOSIS — R0789 Other chest pain: Secondary | ICD-10-CM | POA: Diagnosis present

## 2020-06-24 DIAGNOSIS — Z91010 Allergy to peanuts: Secondary | ICD-10-CM | POA: Diagnosis not present

## 2020-06-24 DIAGNOSIS — J45901 Unspecified asthma with (acute) exacerbation: Secondary | ICD-10-CM | POA: Diagnosis not present

## 2020-06-24 DIAGNOSIS — Z79899 Other long term (current) drug therapy: Secondary | ICD-10-CM | POA: Insufficient documentation

## 2020-06-24 DIAGNOSIS — R079 Chest pain, unspecified: Secondary | ICD-10-CM

## 2020-06-24 LAB — TROPONIN I (HIGH SENSITIVITY)
Troponin I (High Sensitivity): 3 ng/L (ref <18)
Troponin I (High Sensitivity): 3 ng/L (ref <18)

## 2020-06-24 LAB — COMPREHENSIVE METABOLIC PANEL
ALT: 12 U/L (ref 0–44)
AST: 14 U/L — ABNORMAL LOW (ref 15–41)
Albumin: 3.8 g/dL (ref 3.5–5.0)
Alkaline Phosphatase: 47 U/L (ref 38–126)
Anion gap: 10 (ref 5–15)
BUN: 7 mg/dL (ref 6–20)
CO2: 29 mmol/L (ref 22–32)
Calcium: 9.3 mg/dL (ref 8.9–10.3)
Chloride: 103 mmol/L (ref 98–111)
Creatinine, Ser: 0.67 mg/dL (ref 0.44–1.00)
GFR calc Af Amer: >60 mL/min (ref >60)
GFR calc non Af Amer: >60 mL/min (ref >60)
Glucose, Bld: 103 mg/dL — ABNORMAL HIGH (ref 70–99)
Potassium: 4.1 mmol/L (ref 3.5–5.1)
Sodium: 142 mmol/L (ref 135–145)
Total Bilirubin: 0.7 mg/dL (ref 0.3–1.2)
Total Protein: 7.6 g/dL (ref 6.5–8.1)

## 2020-06-24 LAB — CBC WITH DIFFERENTIAL/PLATELET
Abs Immature Granulocytes: 0.02 10*3/uL (ref 0.00–0.07)
Basophils Absolute: 0 10*3/uL (ref 0.0–0.1)
Basophils Relative: 0 %
Eosinophils Absolute: 0.1 10*3/uL (ref 0.0–0.5)
Eosinophils Relative: 2 %
HCT: 39.2 % (ref 36.0–46.0)
Hemoglobin: 12.2 g/dL (ref 12.0–15.0)
Immature Granulocytes: 0 %
Lymphocytes Relative: 21 %
Lymphs Abs: 1.6 10*3/uL (ref 0.7–4.0)
MCH: 27.2 pg (ref 26.0–34.0)
MCHC: 31.1 g/dL (ref 30.0–36.0)
MCV: 87.3 fL (ref 80.0–100.0)
Monocytes Absolute: 0.4 10*3/uL (ref 0.1–1.0)
Monocytes Relative: 5 %
Neutro Abs: 5.7 10*3/uL (ref 1.7–7.7)
Neutrophils Relative %: 72 %
Platelets: 216 10*3/uL (ref 150–400)
RBC: 4.49 MIL/uL (ref 3.87–5.11)
RDW: 13.4 % (ref 11.5–15.5)
WBC: 7.8 10*3/uL (ref 4.0–10.5)
nRBC: 0 % (ref 0.0–0.2)

## 2020-06-24 MED ORDER — IPRATROPIUM-ALBUTEROL 0.5-2.5 (3) MG/3ML IN SOLN
3.0000 mL | Freq: Once | RESPIRATORY_TRACT | Status: AC
  Administered 2020-06-24: 3 mL via RESPIRATORY_TRACT
  Filled 2020-06-24: qty 3

## 2020-06-24 MED ORDER — ALBUTEROL SULFATE (2.5 MG/3ML) 0.083% IN NEBU: 2.5000 mg | INHALATION_SOLUTION | Freq: Four times a day (QID) | RESPIRATORY_TRACT | 12 refills | Status: DC | PRN

## 2020-06-24 MED ORDER — PREDNISONE 20 MG PO TABS
60.0000 mg | ORAL_TABLET | Freq: Once | ORAL | Status: AC
  Administered 2020-06-24: 60 mg via ORAL
  Filled 2020-06-24: qty 3

## 2020-06-24 MED ORDER — PREDNISONE 20 MG PO TABS: 40.0000 mg | ORAL_TABLET | Freq: Every day | ORAL | 0 refills | Status: AC

## 2020-06-24 MED ORDER — ACETAMINOPHEN 500 MG PO TABS
1000.0000 mg | ORAL_TABLET | Freq: Once | ORAL | Status: AC
  Administered 2020-06-24: 1000 mg via ORAL
  Filled 2020-06-24: qty 2

## 2020-06-24 NOTE — ED Provider Notes (Signed)
[facility] Emergency Department Provider Note    First MD Initiated Contact with Patient 06/24/20 1020     (approximate)  I have reviewed the triage vital signs and the nursing notes.   HISTORY  Chief Complaint Chest Pain    HPI Peggy Villa is a 54 y.o. female with the below listed past medical history presents to the ER for left anterior chest wall pain started last night.  Has also had some shortness of breath and wheezing.  Denies any history of cardiac issues.  Denies any fevers.  States that the pain went down her left arm and hand this morning so she wanted to be evaluated.  She does not currently smoke.  Denies any pleuritic discomfort.  Denies any pain radiating to her neck jaw or back.    Past Medical History:  Diagnosis Date  . Asthma   . Hypertension   . Obesity    Family History  Problem Relation Age of Onset  . Lupus Mother   . Cancer Father   . Breast cancer Neg Hx    Past Surgical History:  Procedure Laterality Date  . BREAST REDUCTION SURGERY    . REDUCTION MAMMAPLASTY Bilateral 2006   There are no problems to display for this patient.     Prior to Admission medications   Medication Sig Start Date End Date Taking? Authorizing Provider  albuterol (PROVENTIL HFA;VENTOLIN HFA) 108 (90 Base) MCG/ACT inhaler Inhale 2-4 puffs by mouth every 4 hours as needed for wheezing, cough, and/or shortness of breath 12/26/18   Hinda Kehr, MD  albuterol (PROVENTIL) (2.5 MG/3ML) 0.083% nebulizer solution Take 3 mLs (2.5 mg total) by nebulization every 6 (six) hours as needed for wheezing or shortness of breath. 06/24/20   Merlyn Lot, MD  amLODIPine Besylate (NORVASC PO) amlodipine    [provider]  escitalopram (LEXAPRO) 10 MG tablet Take 10 mg by mouth daily.    [provider]  meloxicam (MOBIC) 15 MG tablet Take 1 tablet (15 mg total) by mouth daily. Take with food. 02/28/20   Lorin Picket, PA-C    predniSONE (DELTASONE) 20 MG tablet Take 2 tablets (40 mg total) by mouth daily for 5 days. 06/24/20 06/29/20  Merlyn Lot, MD    Allergies Penicillin g, Milk-related compounds, Other, Peanut-containing drug products, and Penicillins    Social History Social History   Tobacco Use  . Smoking status: Former Smoker    Packs/day: 0.25  . Smokeless tobacco: Never Used  Vaping Use  . Vaping Use: Never used  Substance Use Topics  . Alcohol use: Yes    Comment: socially  . Drug use: Not Currently    Review of Systems Patient denies headaches, rhinorrhea, blurry vision, numbness, shortness of breath, chest pain, edema, cough, abdominal pain, nausea, vomiting, diarrhea, dysuria, fevers, rashes or hallucinations unless otherwise stated above in HPI. ____________________________________________   PHYSICAL EXAM:  VITAL SIGNS: Vitals:   06/24/20 1100 06/24/20 1240  BP: (!) 170/103 (!) 141/112  Pulse: 67 71  Resp: 19 (!) 27  Temp:    SpO2: 98% 93%    Constitutional: Alert and oriented.  Eyes: Conjunctivae are normal.  Head: Atraumatic. Nose: No congestion/rhinnorhea. Mouth/Throat: Mucous membranes are moist.   Neck: No stridor. Painless ROM.  Cardiovascular: Normal rate, regular rhythm. Grossly normal heart sounds.  Good peripheral circulation. Respiratory: Normal respiratory effort.  No retractions. Lungs with scattered expiratory wheeze Gastrointestinal: Soft and nontender. No distention. No abdominal bruits. No CVA  tenderness. Genitourinary:  Musculoskeletal: ttp of anterior left chest wall. No lower extremity tenderness nor edema.  No joint effusions. Neurologic:  Normal speech and language. No gross focal neurologic deficits are appreciated. No facial droop Skin:  Skin is warm, dry and intact. No rash noted. Psychiatric: Mood and affect are normal. Speech and behavior are normal.  ____________________________________________   LABS (all labs ordered are listed,  but only abnormal results are displayed)  Results for orders placed or performed during the hospital encounter of 06/24/20 (from the past 24 hour(s))  CBC with Differential     Status: None   Collection Time: 06/24/20  7:05 AM  Result Value Ref Range   WBC 7.8 4.0 - 10.5 K/uL   RBC 4.49 3.87 - 5.11 MIL/uL   Hemoglobin 12.2 12.0 - 15.0 g/dL   HCT 39.2 36 - 46 %   MCV 87.3 80.0 - 100.0 fL   MCH 27.2 26.0 - 34.0 pg   MCHC 31.1 30.0 - 36.0 g/dL   RDW 13.4 11.5 - 15.5 %   Platelets 216 150 - 400 K/uL   nRBC 0.0 0.0 - 0.2 %   Neutrophils Relative % 72 %   Neutro Abs 5.7 1.7 - 7.7 K/uL   Lymphocytes Relative 21 %   Lymphs Abs 1.6 0.7 - 4.0 K/uL   Monocytes Relative 5 %   Monocytes Absolute 0.4 0 - 1 K/uL   Eosinophils Relative 2 %   Eosinophils Absolute 0.1 0 - 0 K/uL   Basophils Relative 0 %   Basophils Absolute 0.0 0 - 0 K/uL   Immature Granulocytes 0 %   Abs Immature Granulocytes 0.02 0.00 - 0.07 K/uL  Comprehensive metabolic panel     Status: Abnormal   Collection Time: 06/24/20  7:05 AM  Result Value Ref Range   Sodium 142 135 - 145 mmol/L   Potassium 4.1 3.5 - 5.1 mmol/L   Chloride 103 98 - 111 mmol/L   CO2 29 22 - 32 mmol/L   Glucose, Bld 103 (H) 70 - 99 mg/dL   BUN 7 6 - 20 mg/dL   Creatinine, Ser 0.67 0.44 - 1.00 mg/dL   Calcium 9.3 8.9 - 10.3 mg/dL   Total Protein 7.6 6.5 - 8.1 g/dL   Albumin 3.8 3.5 - 5.0 g/dL   AST 14 (L) 15 - 41 U/L   ALT 12 0 - 44 U/L   Alkaline Phosphatase 47 38 - 126 U/L   Total Bilirubin 0.7 0.3 - 1.2 mg/dL   GFR calc non Af Amer >60 >60 mL/min   GFR calc Af Amer >60 >60 mL/min   Anion gap 10 5 - 15  Troponin I (High Sensitivity)     Status: None   Collection Time: 06/24/20  7:05 AM  Result Value Ref Range   Troponin I (High Sensitivity) 3 <18 ng/L  Troponin I (High Sensitivity)     Status: None   Collection Time: 06/24/20 12:14 PM  Result Value Ref Range   Troponin I (High Sensitivity) 3 <18 ng/L    ____________________________________________  EKG My review and personal interpretation at Time: 6:56   Indication: chest pain  Rate: 75  Rhythm: sinus Axis: normal Other: normal intervals, no stemi ____________________________________________  RADIOLOGY  I personally reviewed all radiographic images ordered to evaluate for the above acute complaints and reviewed radiology reports and findings.  These findings were personally discussed with the patient.  Please see medical record for radiology report.  ____________________________________________   PROCEDURES  Procedure(s) performed:  Procedures    Critical Care performed: no ____________________________________________   INITIAL IMPRESSION / ASSESSMENT AND PLAN / ED COURSE  Pertinent labs & imaging results that were available during my care of the patient were reviewed by me and considered in my medical decision making (see chart for details).   DDX: ACS, pericarditis, esophagitis, boerhaaves, pe, dissection, pna, bronchitis, costochondritis   LAKESHA LEVINSON is a 54 y.o. who presents to the ED with presentation as described above.  Her pain seems to be very musculoskeletal in nature given reproducibility of the pain.  EKG is nonischemic.  Initial troponin is negative.  Low risk by heart score will order serial enzymes to further stratify.  Does not seem clinically consistent with PE.  Does have wheezing on exam suspect a component of asthma.  Will give nebulizer steroid and reassess.  Clinical Course as of Jun 24 1518  Wed Jun 24, 2020  1318 Patient reassessed.  Cardiac enzymes remained stable.  She felt some significant improvement after nebulizer.  Feel like we will give her 1 more ambulating to make sure she is not hypoxic but her presentation is consistent with acute asthma exacerbation.   [PR]  1408 Patient able to ambulate with steady gait no hypoxia.  O2 stayed in the high 90s.  She does appear clinically stable  and appropriate for outpatient follow-up.   [PR]    Clinical Course User Index [PR] Merlyn Lot, MD    The patient was evaluated in Emergency Department today for the symptoms described in the history of present illness. He/she was evaluated in the context of the global COVID-19 pandemic, which necessitated consideration that the patient might be at risk for infection with the SARS-CoV-2 virus that causes COVID-19. Institutional protocols and algorithms that pertain to the evaluation of patients at risk for COVID-19 are in a state of rapid change based on information released by regulatory bodies including the CDC and federal and state organizations. These policies and algorithms were followed during the patient's care in the ED.  As part of my medical decision making, I reviewed the following data within the [place] notes reviewed and incorporated, Labs reviewed, notes from prior ED visits and  Controlled Substance Database   ____________________________________________   FINAL CLINICAL IMPRESSION(S) / ED DIAGNOSES  Final diagnoses:  Chest pain, unspecified type  Mild asthma with acute exacerbation, unspecified whether persistent      NEW MEDICATIONS STARTED DURING THIS VISIT:  New Prescriptions   ALBUTEROL (PROVENTIL) (2.5 MG/3ML) 0.083% NEBULIZER SOLUTION    Take 3 mLs (2.5 mg total) by nebulization every 6 (six) hours as needed for wheezing or shortness of breath.   PREDNISONE (DELTASONE) 20 MG TABLET    Take 2 tablets (40 mg total) by mouth daily for 5 days.     Note:  This document was prepared using Dragon voice recognition software and may include unintentional dictation errors.    Merlyn Lot, MD 06/24/20 [phone]

## 2020-06-24 NOTE — ED Triage Notes (Signed)
Patient ambulatory to triage with steady gait, without difficulty or distress noted; pt reports left sided CP radiating into left arm with no accomp symptoms since yesterday

## 2021-03-26 ENCOUNTER — Emergency Department: Payer: BC Managed Care – PPO

## 2021-03-26 ENCOUNTER — Other Ambulatory Visit: Payer: Self-pay

## 2021-03-26 DIAGNOSIS — Z87891 Personal history of nicotine dependence: Secondary | ICD-10-CM | POA: Insufficient documentation

## 2021-03-26 DIAGNOSIS — Z91010 Allergy to peanuts: Secondary | ICD-10-CM | POA: Diagnosis not present

## 2021-03-26 DIAGNOSIS — I10 Essential (primary) hypertension: Secondary | ICD-10-CM | POA: Insufficient documentation

## 2021-03-26 DIAGNOSIS — Z79899 Other long term (current) drug therapy: Secondary | ICD-10-CM | POA: Diagnosis not present

## 2021-03-26 DIAGNOSIS — J45901 Unspecified asthma with (acute) exacerbation: Secondary | ICD-10-CM | POA: Diagnosis not present

## 2021-03-26 DIAGNOSIS — R0602 Shortness of breath: Secondary | ICD-10-CM | POA: Diagnosis present

## 2021-03-26 LAB — BASIC METABOLIC PANEL
Anion gap: 8 (ref 5–15)
BUN: 10 mg/dL (ref 6–20)
CO2: 27 mmol/L (ref 22–32)
Calcium: 9.5 mg/dL (ref 8.9–10.3)
Chloride: 103 mmol/L (ref 98–111)
Creatinine, Ser: 0.77 mg/dL (ref 0.44–1.00)
GFR, Estimated: >60 mL/min (ref >60)
Glucose, Bld: 115 mg/dL — ABNORMAL HIGH (ref 70–99)
Potassium: 4.3 mmol/L (ref 3.5–5.1)
Sodium: 138 mmol/L (ref 135–145)

## 2021-03-26 NOTE — ED Triage Notes (Addendum)
Pt state she has had shortness of breath with exertion for the past week, pt states it is worse tonight. Pt states she had pneumonia last year and this feels the same. Pt states she started coughing tonight and has had some nasal drainage and right eye drainage.

## 2021-03-27 ENCOUNTER — Emergency Department
Admission: EM | Admit: 2021-03-27 | Discharge: 2021-03-27 | Disposition: A | Discharge status: Home / Self Care | Payer: BC Managed Care – PPO | Attending: Emergency Medicine | Admitting: Emergency Medicine

## 2021-03-27 DIAGNOSIS — R0602 Shortness of breath: Secondary | ICD-10-CM

## 2021-03-27 DIAGNOSIS — J45901 Unspecified asthma with (acute) exacerbation: Secondary | ICD-10-CM

## 2021-03-27 LAB — CBC WITH DIFFERENTIAL/PLATELET
Abs Immature Granulocytes: 0.02 10*3/uL (ref 0.00–0.07)
Basophils Absolute: 0 10*3/uL (ref 0.0–0.1)
Basophils Relative: 0 %
Eosinophils Absolute: 0.1 10*3/uL (ref 0.0–0.5)
Eosinophils Relative: 2 %
HCT: 45 % (ref 36.0–46.0)
Hemoglobin: 13.8 g/dL (ref 12.0–15.0)
Immature Granulocytes: 0 %
Lymphocytes Relative: 25 %
Lymphs Abs: 2 10*3/uL (ref 0.7–4.0)
MCH: 26.7 pg (ref 26.0–34.0)
MCHC: 30.7 g/dL (ref 30.0–36.0)
MCV: 87.2 fL (ref 80.0–100.0)
Monocytes Absolute: 0.4 10*3/uL (ref 0.1–1.0)
Monocytes Relative: 5 %
Neutro Abs: 5.4 10*3/uL (ref 1.7–7.7)
Neutrophils Relative %: 68 %
Platelets: 227 10*3/uL (ref 150–400)
RBC: 5.16 MIL/uL — ABNORMAL HIGH (ref 3.87–5.11)
RDW: 12.6 % (ref 11.5–15.5)
WBC: 7.9 10*3/uL (ref 4.0–10.5)
nRBC: 0 % (ref 0.0–0.2)

## 2021-03-27 LAB — TROPONIN I (HIGH SENSITIVITY): Troponin I (High Sensitivity): 7 ng/L

## 2021-03-27 MED ORDER — PREDNISONE 20 MG PO TABS: 60.0000 mg | ORAL_TABLET | Freq: Every day | ORAL | 0 refills | Status: AC

## 2021-03-27 MED ORDER — IPRATROPIUM-ALBUTEROL 0.5-2.5 (3) MG/3ML IN SOLN
3.0000 mL | Freq: Once | RESPIRATORY_TRACT | Status: AC
  Administered 2021-03-27: 04:00:00 3 mL via RESPIRATORY_TRACT
  Filled 2021-03-27: qty 3

## 2021-03-27 MED ORDER — PREDNISONE 20 MG PO TABS
60.0000 mg | ORAL_TABLET | Freq: Once | ORAL | Status: AC
  Administered 2021-03-27: 04:00:00 60 mg via ORAL
  Filled 2021-03-27: qty 3

## 2021-03-27 MED ORDER — AMLODIPINE BESYLATE 5 MG PO TABS
10.0000 mg | ORAL_TABLET | Freq: Once | ORAL | Status: AC
  Administered 2021-03-27: 05:00:00 10 mg via ORAL
  Filled 2021-03-27: qty 2

## 2021-03-27 NOTE — ED Provider Notes (Signed)
[facility] Emergency Department Provider Note   ____________________________________________   Event Date/Time   First MD Initiated Contact with Patient 03/27/21 [phone]     (approximate)  I have reviewed the triage vital signs and the nursing notes.   HISTORY  Chief Complaint Shortness of Breath    HPI Peggy Villa is a 55 y.o. female with past medical history of hypertension and asthma who presents to the ED complaining of shortness of breath.  Patient reports that she has had increasing difficulty breathing for about the past week.  She denies any associated fevers or cough, does state that she has been feeling tight in her chest but denies any overt chest pain.  She describes current symptoms as similar to when she was diagnosed with pneumonia in the past.  She denies any pain or swelling in her legs.  She has not had any sick contacts, nausea, vomiting, or diarrhea.  She has been using her inhaler at home without significant relief.        Past Medical History:  Diagnosis Date   Asthma    Hypertension    Obesity     There are no problems to display for this patient.   Past Surgical History:  Procedure Laterality Date   BREAST REDUCTION SURGERY     REDUCTION MAMMAPLASTY Bilateral 2006    Prior to Admission medications   Medication Sig Start Date End Date Taking? Authorizing Provider  predniSONE (DELTASONE) 20 MG tablet Take 3 tablets (60 mg total) by mouth daily with breakfast for 5 days. 03/27/21 04/01/21 Yes Blake Divine, MD  albuterol (PROVENTIL HFA;VENTOLIN HFA) 108 (90 Base) MCG/ACT inhaler Inhale 2-4 puffs by mouth every 4 hours as needed for wheezing, cough, and/or shortness of breath 12/26/18   Hinda Kehr, MD  albuterol (PROVENTIL) (2.5 MG/3ML) 0.083% nebulizer solution Take 3 mLs (2.5 mg total) by nebulization every 6 (six) hours as needed for wheezing or shortness of breath. 06/24/20   Merlyn Lot, MD  amLODIPine  Besylate (NORVASC PO) amlodipine    [provider]  escitalopram (LEXAPRO) 10 MG tablet Take 10 mg by mouth daily.    [provider]  meloxicam (MOBIC) 15 MG tablet Take 1 tablet (15 mg total) by mouth daily. Take with food. 02/28/20   Lorin Picket, PA-C    Allergies Penicillin g, Milk-related compounds, Other, Peanut-containing drug products, and Penicillins  Family History  Problem Relation Age of Onset   Lupus Mother    Cancer Father    Breast cancer Neg Hx     Social History Social History   Tobacco Use   Smoking status: Former    Packs/day: 0.25    Pack years: 0.00    Types: Cigarettes   Smokeless tobacco: Never  Vaping Use   Vaping Use: Never used  Substance Use Topics   Alcohol use: Yes    Comment: socially   Drug use: Not Currently    Review of Systems  Constitutional: No fever/chills Eyes: No visual changes. ENT: No sore throat. Cardiovascular: Positive for chest tightness. Respiratory: Positive for shortness of breath. Gastrointestinal: No abdominal pain.  No nausea, no vomiting.  No diarrhea.  No constipation. Genitourinary: Negative for dysuria. Musculoskeletal: Negative for back pain. Skin: Negative for rash. Neurological: Negative for headaches, focal weakness or numbness.  ____________________________________________   PHYSICAL EXAM:  VITAL SIGNS: ED Triage Vitals  Enc Vitals Group     BP 03/26/21 2325 (!) 183/130     Pulse  Rate 03/26/21 2318 81     Resp 03/26/21 2318 20     Temp 03/26/21 2318 98.4 F (36.9 C)     Temp Source 03/26/21 2318 Oral     SpO2 03/26/21 2318 98 %     Weight 03/26/21 2318 (!) 309 lb (140.2 kg)     Height 03/26/21 2318 5\' 8"  (1.727 m)     Head Circumference --      Peak Flow --      Pain Score 03/26/21 2323 3     Pain Loc --      Pain Edu? --      Excl. in [place]? --     Constitutional: Alert and oriented. Eyes: Conjunctivae are normal. Head: Atraumatic. Nose: No  congestion/rhinnorhea. Mouth/Throat: Mucous membranes are moist. Neck: Normal ROM Cardiovascular: Normal rate, regular rhythm. Grossly normal heart sounds. Respiratory: Normal respiratory effort.  No retractions. Lungs with faint expiratory wheezing bilaterally. Gastrointestinal: Soft and nontender. No distention. Genitourinary: deferred Musculoskeletal: No lower extremity tenderness nor edema. Neurologic:  Normal speech and language. No gross focal neurologic deficits are appreciated. Skin:  Skin is warm, dry and intact. No rash noted. Psychiatric: Mood and affect are normal. Speech and behavior are normal.  ____________________________________________   LABS (all labs ordered are listed, but only abnormal results are displayed)  Labs Reviewed  BASIC METABOLIC PANEL - Abnormal; Notable for the following components:      Result Value   Glucose, Bld 115 (*)    All other components within normal limits  CBC WITH DIFFERENTIAL/PLATELET - Abnormal; Notable for the following components:   RBC 5.16 (*)    All other components within normal limits  CBC WITH DIFFERENTIAL/PLATELET  TROPONIN I (HIGH SENSITIVITY)   ____________________________________________  EKG  ED ECG REPORT I, Blake Divine, the attending physician, personally viewed and interpreted this ECG.   Date: 03/27/2021  EKG Time: 23:16  Rate: 88  Rhythm: normal sinus rhythm  Axis: Normal  Intervals:none  ST&T Change: None   PROCEDURES  Procedure(s) performed (including Critical Care):  Procedures   ____________________________________________   INITIAL IMPRESSION / ASSESSMENT AND PLAN / ED COURSE      55 year old female with a past medical history of hypertension and asthma who presents to the ED with increasing difficulty breathing over the course of the past week.  She is not in any respiratory distress and is maintaining O2 sats on room air, does have some mild expiratory wheezing that could be  contributing to her symptoms, we will treat with prednisone and breathing treatments.  EKG shows no evidence of arrhythmia or ischemia, we will add on troponin but low suspicion for ACS or other cardiac etiology for her symptoms at this time.  Given wheezing with reassuring vital signs, low suspicion for PE.  Chest x-ray reviewed by me and shows no infiltrate, edema, or effusion.  Troponin is within normal limits, patient reports symptoms are much improved following dose of steroids and breathing treatment.  Wheezing is improved on reevaluation and patient is appropriate for discharge home with PCP follow-up.  Will prescribe steroids, she states she has plenty of albuterol available at home.  She was counseled to return to the ED for new worsening symptoms, patient agrees with plan.      ____________________________________________   FINAL CLINICAL IMPRESSION(S) / ED DIAGNOSES  Final diagnoses:  Exacerbation of asthma, unspecified asthma severity, unspecified whether persistent  Shortness of breath     ED Discharge Orders  Ordered    predniSONE (DELTASONE) 20 MG tablet  Daily with breakfast        03/27/21 0518             Note:  This document was prepared using Dragon voice recognition software and may include unintentional dictation errors.    Blake Divine, MD 03/27/21 [phone]

## 2021-03-27 NOTE — ED Notes (Signed)
ED Provider at bedside. 

## 2021-03-27 NOTE — ED Notes (Signed)
Pt given a cup of ice per pt request.

## 2022-02-16 ENCOUNTER — Other Ambulatory Visit: Payer: Self-pay | Admitting: Family Medicine

## 2022-02-16 DIAGNOSIS — Z1231 Encounter for screening mammogram for malignant neoplasm of breast: Secondary | ICD-10-CM

## 2022-03-09 IMAGING — CR DG CHEST 2V
1 series · 2 of 2 positions shown · non-contrast
Comparison: 02/28/2020

CLINICAL DATA: Chest pain

EXAM:
CHEST - 2 VIEW

[Series 1: dg chest 2 view · 0.14mm/px · 2 of 2 slices shown]
[im 1/2]
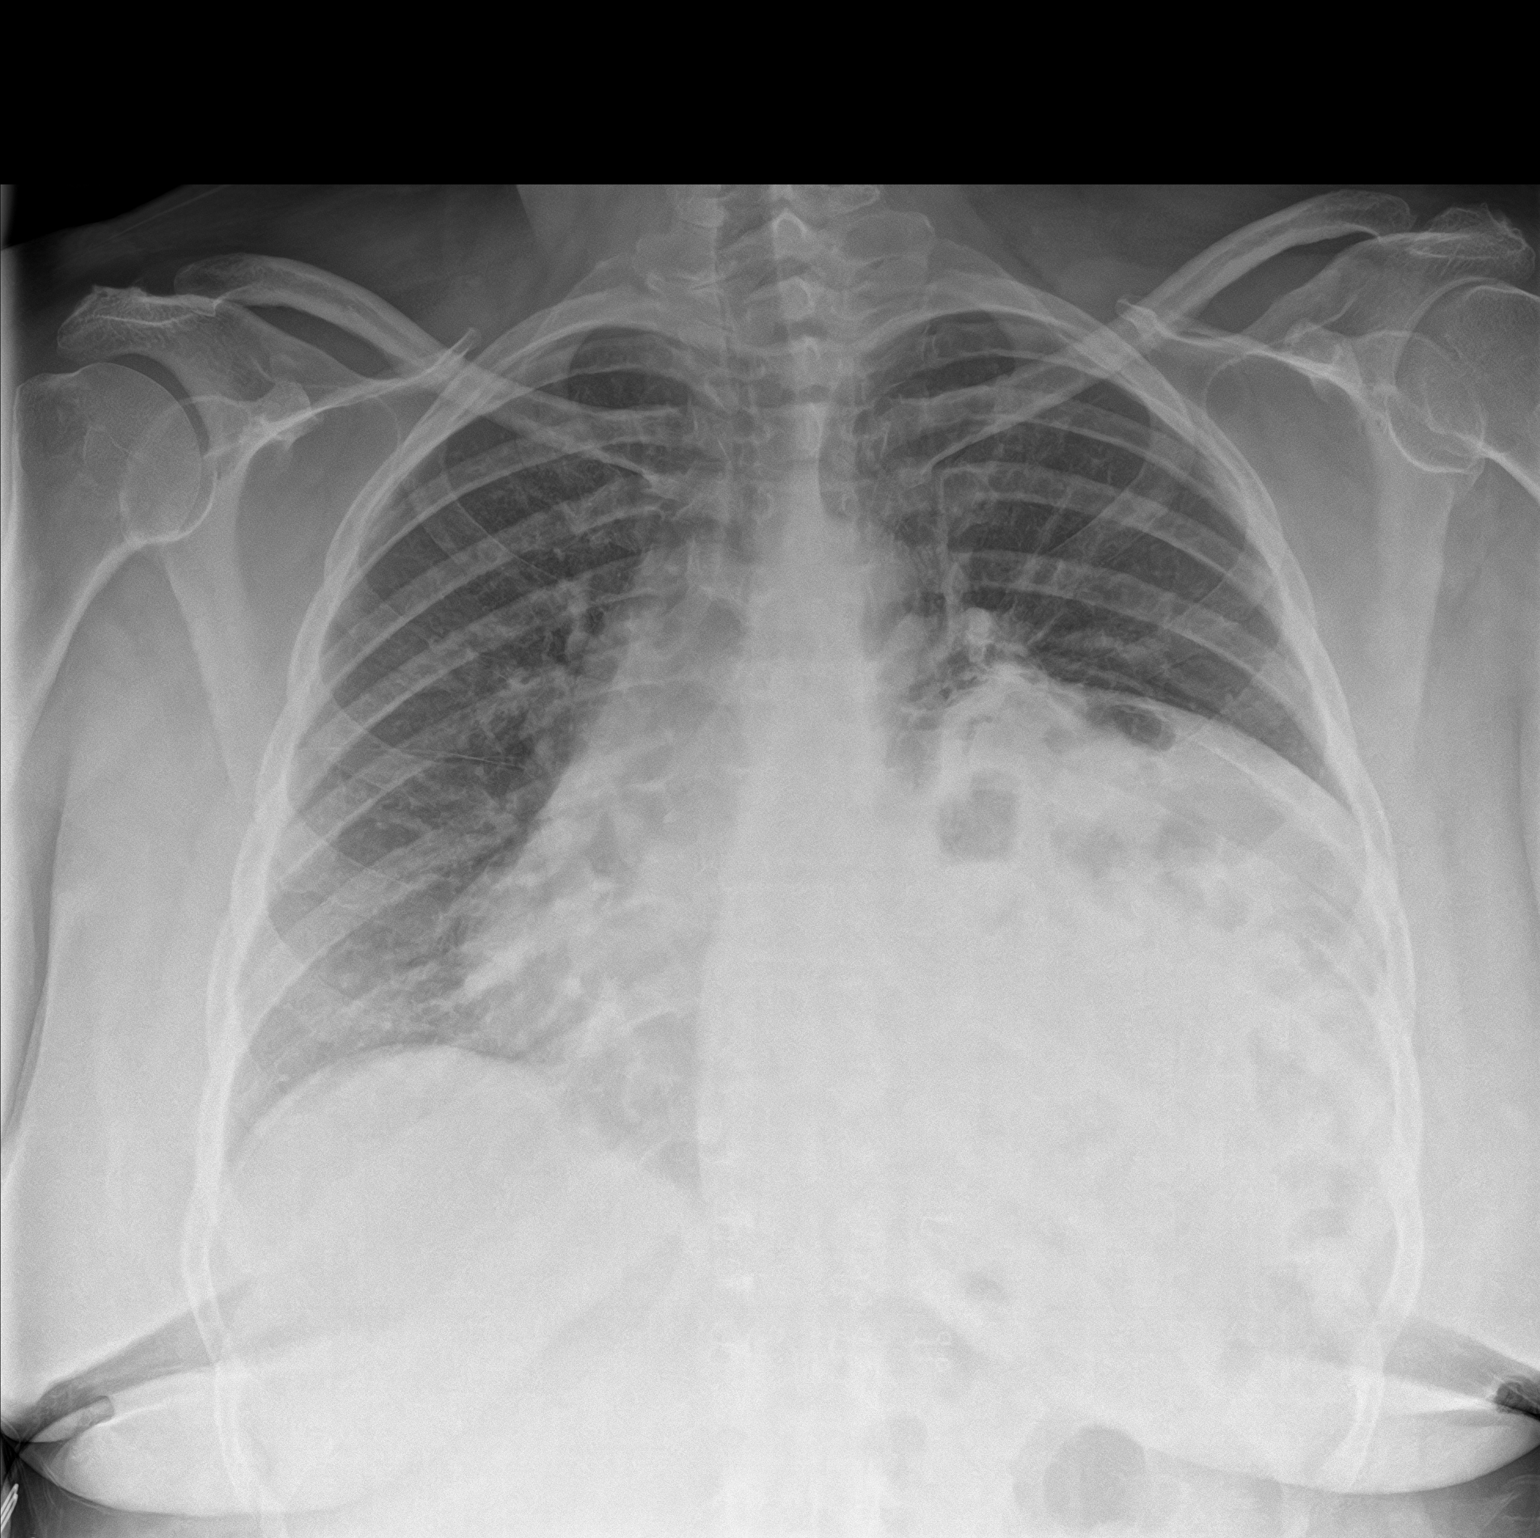
[im 2/2]
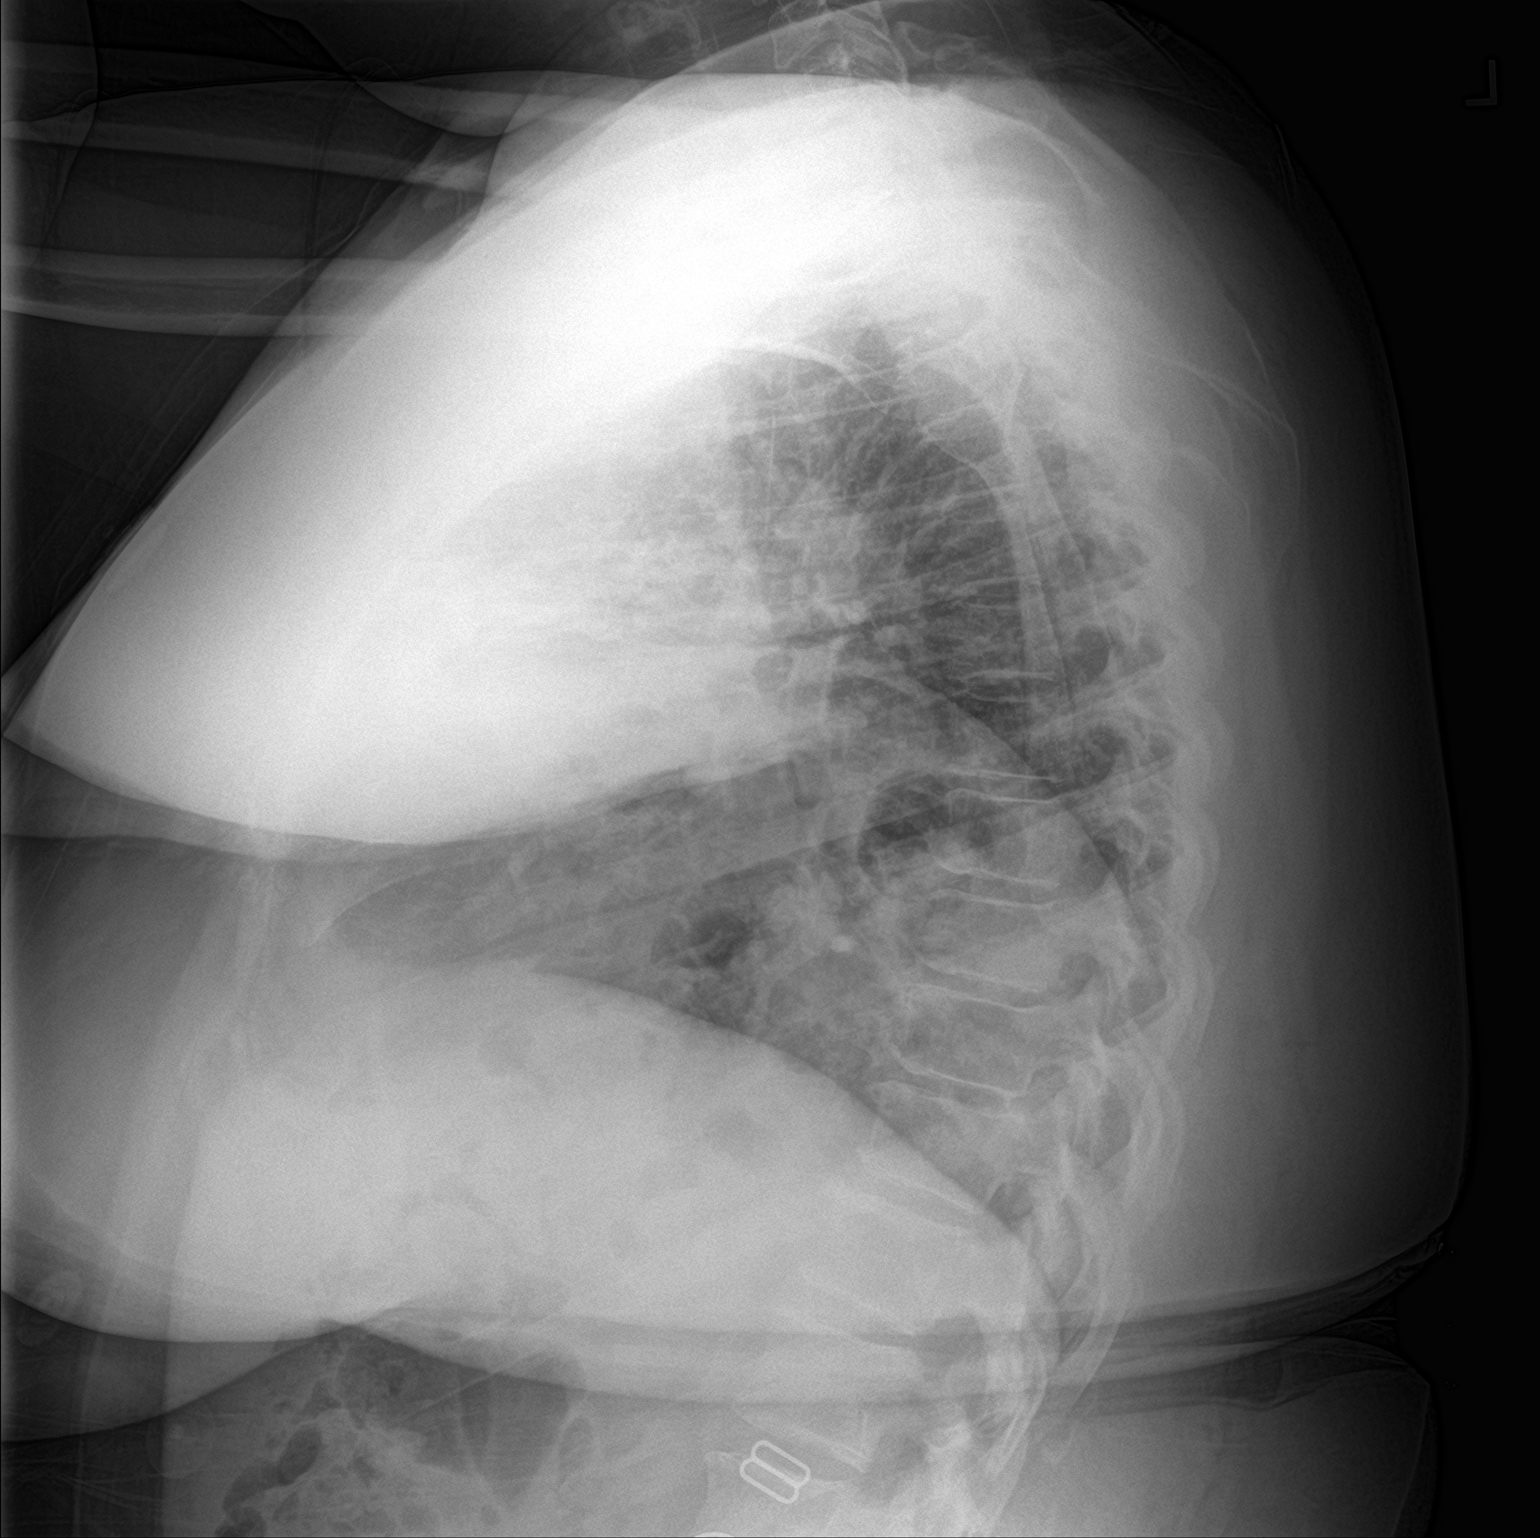

[2 of 2 positions shown; findings below may reference images not displayed]

FINDINGS: Chronic very elevated left diaphragm obscuring the left base.
Generous heart size but no cardiomegaly suspected when allowing for
mediastinal shift. The non obscured lungs are clear. No visible
effusion or pneumothorax
IMPRESSION: 1. No acute finding.
2. Long-standing and marked left diaphragm elevation.

## 2022-03-23 ENCOUNTER — Inpatient Hospital Stay: Admission: RE | Admit: 2022-03-23 | Payer: BC Managed Care – PPO | Source: Ambulatory Visit

## 2022-03-28 ENCOUNTER — Ambulatory Visit: Payer: BC Managed Care – PPO | Admitting: Urology

## 2022-04-20 ENCOUNTER — Other Ambulatory Visit: Payer: Self-pay

## 2022-04-20 DIAGNOSIS — R32 Unspecified urinary incontinence: Secondary | ICD-10-CM

## 2022-04-22 ENCOUNTER — Encounter: Payer: Self-pay | Admitting: Urology

## 2022-04-22 ENCOUNTER — Other Ambulatory Visit
Admission: RE | Admit: 2022-04-22 | Discharge: 2022-04-22 | Disposition: A | Discharge status: Home / Self Care | Payer: BC Managed Care – PPO | Attending: Urology | Admitting: Urology

## 2022-04-22 ENCOUNTER — Ambulatory Visit (INDEPENDENT_AMBULATORY_CARE_PROVIDER_SITE_OTHER): Payer: BC Managed Care – PPO | Admitting: Urology

## 2022-04-22 VITALS — BP 156/107 | HR 69 | Ht 67.0 in | Wt 304.0 lb

## 2022-04-22 DIAGNOSIS — R32 Unspecified urinary incontinence: Secondary | ICD-10-CM | POA: Diagnosis present

## 2022-04-22 LAB — URINALYSIS, COMPLETE (UACMP) WITH MICROSCOPIC
Glucose, UA: NEGATIVE mg/dL
Hgb urine dipstick: NEGATIVE
Ketones, ur: NEGATIVE mg/dL
Leukocytes,Ua: NEGATIVE
Nitrite: NEGATIVE
Protein, ur: NEGATIVE mg/dL
Specific Gravity, Urine: >1.03 — ABNORMAL HIGH (ref 1.005–1.030)
pH: 5.5 (ref 5.0–8.0)

## 2022-04-22 LAB — BLADDER SCAN AMB NON-IMAGING

## 2022-04-22 NOTE — Progress Notes (Signed)
04/22/22 3:36 PM   Peggy Villa 1966-07-18 [id number]  Referring provider:  Marguerita Merles, [address] [place],  [place] [address] Chief Complaint  Patient presents with   Urinary Incontinence      HPI: Peggy Villa is a 56 y.o.female who presents today for further evaluation of mixed incontinence.   In April 2023 she had POCT dip UA that had 1+ blood.   She was seen by her PCP, Ranae Plumber, PA-C for her incontinence. She was recently seen on 02/03/2022 she was noted to have incontinence, urinary accidents when she can't make it to the restroom in time, and leaking without urge. She was started on a trial of vesicare and recommended Kegel exercises.  She reports urinary incontinence she had an accident recently at work she did not get the urge that she needed to void. She denies when she laughs, coughs, and sneezes. She has frequency that she attributes to her blood pressure medication.  She voids 3-4x nightly. She had improvement on vesicare.   PVR 0 ml today. UA unremarkable.   She has a history of smoking.   She does not have her uterus.  She is postmenopausal.  PMH: Past Medical History:  Diagnosis Date   Asthma    Essential hypertension 09/11/2014   Hypertension    Hypothyroidism 09/11/2014   Obesity    Shoulder pain 12/02/2016    Surgical History: Past Surgical History:  Procedure Laterality Date   BREAST REDUCTION SURGERY     REDUCTION MAMMAPLASTY Bilateral 2006   TUBAL LIGATION      Home Medications:  Allergies as of 04/22/2022       Reactions   Penicillin G Swelling   Milk-related Compounds    Other    Peanut-containing Drug Products    Penicillins         Medication List        Accurate as of April 22, 2022 11:59 PM. If you have any questions, ask your nurse or doctor.          STOP taking these medications    meloxicam 15 MG tablet Commonly known as: Mobic Stopped by: Hollice Espy, MD       TAKE these  medications    albuterol 108 (90 Base) MCG/ACT inhaler Commonly known as: VENTOLIN HFA Inhale 2-4 puffs by mouth every 4 hours as needed for wheezing, cough, and/or shortness of breath What changed: Another medication with the same name was removed. Continue taking this medication, and follow the directions you see here. Changed by: Hollice Espy, MD   amLODipine 10 MG tablet Commonly known as: NORVASC Take 10 mg by mouth daily. What changed: Another medication with the same name was removed. Continue taking this medication, and follow the directions you see here. Changed by: Hollice Espy, MD   escitalopram 10 MG tablet Commonly known as: LEXAPRO Take 10 mg by mouth daily.   pantoprazole 40 MG tablet Commonly known as: PROTONIX Take 40 mg by mouth daily.   solifenacin 10 MG tablet Commonly known as: VESICARE Take 10 mg by mouth daily.   Symbicort 160-4.5 MCG/ACT inhaler Generic drug: budesonide-formoterol Inhale into the lungs.        Allergies:  Allergies  Allergen Reactions   Penicillin G Swelling   Milk-Related Compounds    Other    Peanut-Containing Drug Products    Penicillins     Family History: Family History  Problem Relation Age of Onset   Lupus Mother  Cancer Father    Breast cancer Neg Hx    Prostate cancer Neg Hx    Bladder Cancer Neg Hx    Kidney cancer Neg Hx     Social History:  reports that she has quit smoking. Her smoking use included cigarettes. She smoked an average of .25 packs per day. She has never used smokeless tobacco. She reports current alcohol use. She reports that she does not currently use drugs.   Physical Exam: BP (!) 156/107   Pulse 69   Ht '5\' 7"'$  (1.702 m)   Wt (!) 304 lb (137.9 kg)   LMP 02/01/2016   BMI 47.61 kg/m   Constitutional:  Alert and oriented, No acute distress. HEENT: [place] AT, moist mucus membranes.  Trachea midline, no masses. Cardiovascular: No clubbing, cyanosis, or edema. Respiratory: Normal  respiratory effort, no increased work of breathing. Skin: No rashes, bruises or suspicious lesions. Neurologic: Grossly intact, no focal deficits, moving all 4 extremities. Psychiatric: Normal mood and affect.  Laboratory Data:  Lab Results  Component Value Date   CREATININE 0.77 03/26/2021   Urinalysis Component     Latest Ref Rng 04/22/2022  Color, Urine     YELLOW  YELLOW   Appearance     CLEAR  CLEAR   Specific Gravity, Urine     1.005 - 1.030  >1.030 (H)   pH     5.0 - 8.0  5.5   Glucose, UA     NEGATIVE mg/dL NEGATIVE   Hgb urine dipstick     NEGATIVE  NEGATIVE   Bilirubin Urine     NEGATIVE  SMALL !   Ketones, ur     NEGATIVE mg/dL NEGATIVE   Protein     NEGATIVE mg/dL NEGATIVE   Nitrite     NEGATIVE  NEGATIVE   Leukocytes,Ua     NEGATIVE  NEGATIVE   Squamous Epithelial / LPF     0 - 5  0-5   WBC, UA     0 - 5 WBC/hpf 6-10   RBC / HPF     0 - 5 RBC/hpf 0-5   Bacteria, UA     NONE SEEN  FEW !   Mucus PRESENT     Legend: (H) High ! Abnormal  Pertinent Imaging: Post Void Residual 04/22/2022  Scan Result 44m     Assessment & Plan:   Urinary incontinence, primarily urge incontinence - UA unremarkable without concern for any underlying conditions - She is emptying adequately today  - She has had improvement on vesicare. Continue this medication.  We briefly discussed two classes of medications beta 3 agonist and anticholinergics.   Return if symptoms worsen or fail to improve.  IConley Rollsas a sEducation administratorfor AHollice Espy MD.,have documented all relevant documentation on the behalf of AHollice Espy MD,as directed by  AHollice Espy MD while in the presence of AHollice Espy MD.  I have reviewed the above documentation for accuracy and completeness, and I agree with the above.   AHollice Espy MD  [facility] [address] [place] [phone]

## 2022-08-15 ENCOUNTER — Ambulatory Visit
Admission: EM | Admit: 2022-08-15 | Discharge: 2022-08-15 | Disposition: A | Discharge status: Home / Self Care | Payer: BC Managed Care – PPO | Attending: Emergency Medicine | Admitting: Emergency Medicine

## 2022-08-15 DIAGNOSIS — R300 Dysuria: Secondary | ICD-10-CM | POA: Diagnosis present

## 2022-08-15 DIAGNOSIS — N760 Acute vaginitis: Secondary | ICD-10-CM | POA: Insufficient documentation

## 2022-08-15 DIAGNOSIS — B9689 Other specified bacterial agents as the cause of diseases classified elsewhere: Secondary | ICD-10-CM | POA: Diagnosis present

## 2022-08-15 DIAGNOSIS — B372 Candidiasis of skin and nail: Secondary | ICD-10-CM | POA: Diagnosis not present

## 2022-08-15 LAB — WET PREP, GENITAL
Sperm: NONE SEEN
Trich, Wet Prep: NONE SEEN
WBC, Wet Prep HPF POC: <10 — AB (ref <10)
Yeast Wet Prep HPF POC: NONE SEEN

## 2022-08-15 LAB — URINALYSIS, ROUTINE W REFLEX MICROSCOPIC
Glucose, UA: NEGATIVE mg/dL
Hgb urine dipstick: NEGATIVE
Ketones, ur: 15 mg/dL — AB
Leukocytes,Ua: NEGATIVE
Nitrite: NEGATIVE
Protein, ur: NEGATIVE mg/dL
Specific Gravity, Urine: 1.02 (ref 1.005–1.030)
pH: 5.5 (ref 5.0–8.0)

## 2022-08-15 MED ORDER — MICONAZOLE NITRATE 2 % EX OINT: 1.0000 | TOPICAL_OINTMENT | Freq: Two times a day (BID) | CUTANEOUS | 0 refills | Status: DC

## 2022-08-15 MED ORDER — FLUCONAZOLE 150 MG PO TABS: 150.0000 mg | ORAL_TABLET | Freq: Once | ORAL | 1 refills | Status: AC

## 2022-08-15 MED ORDER — HYDROCORTISONE 1 % EX OINT: 1.0000 | TOPICAL_OINTMENT | Freq: Two times a day (BID) | CUTANEOUS | 0 refills | Status: DC

## 2022-08-15 MED ORDER — METRONIDAZOLE 500 MG PO TABS: 500.0000 mg | ORAL_TABLET | Freq: Two times a day (BID) | ORAL | 0 refills | Status: AC

## 2022-08-15 NOTE — Discharge Instructions (Signed)
Finish the Flagyl, even if you feel better.  Dry yourself off with a hair dryer after bathing.  Apply the miconazole ointment as written until symptoms resolve.  You can use the hydrocortisone ointment sparingly for itching.  You can try the Diflucan if the miconazole and hydrocortisone ointment are not working.

## 2022-08-15 NOTE — ED Provider Notes (Signed)
HPI  SUBJECTIVE:  Peggy Villa is a 56 y.o. female who presents with dysuria, urinary urgency starting today and 3 days of a burning, pruritic rash in her groin, and along her labia.  No blisters, discharge, odor, new soaps or detergents.  No urinary frequency, cloudy or odorous urine, hematuria, nausea, fevers, abdominal, back, pelvic pain.  She reports 3 episodes of vomiting in the past 24 hours, but is tolerating p.o.  She has not been sexually active since March.  No recent antibiotics.  She has tried shaving cream, airing herself out by not wearing underwear with improvement in her symptoms.  No aggravating factors.  She has a past medical history of UTI, hypertension,eczema.  No history of pyelonephritis, nephrolithiasis, BV, yeast, STDs, diabetes.  [facility]   Past Medical History:  Diagnosis Date   Asthma    Essential hypertension 09/11/2014   Hypertension    Hypothyroidism 09/11/2014   Obesity    Shoulder pain 12/02/2016    Past Surgical History:  Procedure Laterality Date   BREAST REDUCTION SURGERY     REDUCTION MAMMAPLASTY Bilateral 2006   TUBAL LIGATION      Family History  Problem Relation Age of Onset   Lupus Mother    Cancer Father    Breast cancer Neg Hx    Prostate cancer Neg Hx    Bladder Cancer Neg Hx    Kidney cancer Neg Hx     Social History   Tobacco Use   Smoking status: Former    Packs/day: 0.25    Types: Cigarettes   Smokeless tobacco: Never  Vaping Use   Vaping Use: Never used  Substance Use Topics   Alcohol use: Yes    Comment: socially   Drug use: Not Currently    No current facility-administered medications for this encounter.  Current Outpatient Medications:    albuterol (PROVENTIL HFA;VENTOLIN HFA) 108 (90 Base) MCG/ACT inhaler, Inhale 2-4 puffs by mouth every 4 hours as needed for wheezing, cough, and/or shortness of breath, Disp: 1 Inhaler, Rfl: 1   amLODipine (NORVASC) 10 MG tablet, Take 10 mg by mouth daily., Disp: ,  Rfl:    escitalopram (LEXAPRO) 10 MG tablet, Take 10 mg by mouth daily., Disp: , Rfl:    fluconazole (DIFLUCAN) 150 MG tablet, Take 1 tablet (150 mg total) by mouth once for 1 dose. 1 tab po x 1. May repeat in 72 hours if no improvement, Disp: 2 tablet, Rfl: 1   hydrocortisone 1 % ointment, Apply 1 Application topically 2 (two) times daily., Disp: 30 g, Rfl: 0   metroNIDAZOLE (FLAGYL) 500 MG tablet, Take 1 tablet (500 mg total) by mouth 2 (two) times daily for 7 days., Disp: 14 tablet, Rfl: 0   Miconazole Nitrate 2 % OINT, Apply 1 Application topically 2 (two) times daily. Apply until rash is gone and for 1 week thereafter, Disp: 56 g, Rfl: 0   pantoprazole (PROTONIX) 40 MG tablet, Take 40 mg by mouth daily., Disp: , Rfl:    SYMBICORT 160-4.5 MCG/ACT inhaler, Inhale into the lungs., Disp: , Rfl:    solifenacin (VESICARE) 10 MG tablet, Take 10 mg by mouth daily., Disp: , Rfl:   Allergies  Allergen Reactions   Milk Protein Anaphylaxis   Penicillin G Swelling   Milk-Related Compounds    Other    Peanut-Containing Drug Products    Penicillins      ROS  As noted in HPI.   Physical Exam  BP (!) 123/93 (  BP Location: Left Arm)   Pulse 75   Temp 98.3 F (36.8 C) (Oral)   Ht '5\' 7"'$  (1.702 m)   Wt (!) 137 kg   LMP 02/01/2016   SpO2 100%   BMI 47.30 kg/m   Constitutional: Well developed, well nourished, no acute distress Eyes:  EOMI, conjunctiva normal bilaterally HENT: Normocephalic, atraumatic,mucus membranes moist Respiratory: Normal inspiratory effort Cardiovascular: Normal rate GI: nondistended soft, nontender. No suprapubic tenderness  back: No CVA tenderness GU: Dark, nontender, scaly skin in between the thighs and along the perineum/external labia.  External genitalia normal.  No vesicles.  Positive odorous vaginal discharge.  Vaginal walls, os norma. Chaperone present during exam skin: No rash, skin intact Musculoskeletal: no deformities Neurologic: Alert & oriented x 3,  no focal neuro deficits Psychiatric: Speech and behavior appropriate   ED Course   Medications - No data to display  Orders Placed This Encounter  Procedures   Pelvic exam    Standing Status:   Standing    Number of Occurrences:   1   Wet prep, genital    Standing Status:   Standing    Number of Occurrences:   1   Urinalysis, Routine w reflex microscopic    Standing Status:   Standing    Number of Occurrences:   1    Results for orders placed or performed during the hospital encounter of 08/15/22 (from the past 24 hour(s))  Urinalysis, Routine w reflex microscopic     Status: Abnormal   Collection Time: 08/15/22  3:59 PM  Result Value Ref Range   Color, Urine YELLOW YELLOW   APPearance CLOUDY (A) CLEAR   Specific Gravity, Urine 1.020 1.005 - 1.030   pH 5.5 5.0 - 8.0   Glucose, UA NEGATIVE NEGATIVE mg/dL   Hgb urine dipstick NEGATIVE NEGATIVE   Bilirubin Urine SMALL (A) NEGATIVE   Ketones, ur 15 (A) NEGATIVE mg/dL   Protein, ur NEGATIVE NEGATIVE mg/dL   Nitrite NEGATIVE NEGATIVE   Leukocytes,Ua NEGATIVE NEGATIVE  Wet prep, genital     Status: Abnormal   Collection Time: 08/15/22  5:24 PM   Specimen: Vaginal  Result Value Ref Range   Yeast Wet Prep HPF POC NONE SEEN NONE SEEN   Trich, Wet Prep NONE SEEN NONE SEEN   Clue Cells Wet Prep HPF POC PRESENT (A) NONE SEEN   WBC, Wet Prep HPF POC <10 (A) <10   Sperm NONE SEEN    No results found.  ED Clinical Impression  1. BV (bacterial vaginosis)   2. Dysuria   3. Yeast dermatitis     ED Assessment/Plan    Patient has bacterial vaginosis.  will send home with Flagyl for 1 week I also wonder if she has a yeast dermatitis versus an eczema flare.  Sending home with miconazole cream for her to apply until symptoms resolve, low potency hydrocortisone for the itching.   Will also send home with Diflucan 150 mg once, may repeat in 72 hours if no improvement. Follow-up with PCP as needed.  Discussed labs, treatment plan,  medical decision making, and plan for follow-up with patient.  Patient agrees with plan  Meds ordered this encounter  Medications   hydrocortisone 1 % ointment    Sig: Apply 1 Application topically 2 (two) times daily.    Dispense:  30 g    Refill:  0   Miconazole Nitrate 2 % OINT    Sig: Apply 1 Application topically 2 (two) times daily. Apply  until rash is gone and for 1 week thereafter    Dispense:  56 g    Refill:  0   metroNIDAZOLE (FLAGYL) 500 MG tablet    Sig: Take 1 tablet (500 mg total) by mouth 2 (two) times daily for 7 days.    Dispense:  14 tablet    Refill:  0   fluconazole (DIFLUCAN) 150 MG tablet    Sig: Take 1 tablet (150 mg total) by mouth once for 1 dose. 1 tab po x 1. May repeat in 72 hours if no improvement    Dispense:  2 tablet    Refill:  1    *This clinic note was created using [employer]. Therefore, there may be occasional mistakes despite careful proofreading.  ?     Melynda Ripple, MD 08/16/22 1440

## 2022-08-15 NOTE — ED Triage Notes (Signed)
Pt c/o dysuria onset this morning, some lower back pain

## 2022-10-04 ENCOUNTER — Inpatient Hospital Stay
Admission: EM | Admit: 2022-10-04 | Discharge: 2022-10-06 | DRG: 202 | Disposition: A | Discharge status: Home / Self Care | Payer: BC Managed Care – PPO | Attending: Obstetrics and Gynecology | Admitting: Obstetrics and Gynecology

## 2022-10-04 ENCOUNTER — Emergency Department: Payer: BC Managed Care – PPO

## 2022-10-04 ENCOUNTER — Other Ambulatory Visit: Payer: Self-pay

## 2022-10-04 DIAGNOSIS — I160 Hypertensive urgency: Secondary | ICD-10-CM | POA: Diagnosis not present

## 2022-10-04 DIAGNOSIS — Z720 Tobacco use: Secondary | ICD-10-CM

## 2022-10-04 DIAGNOSIS — E039 Hypothyroidism, unspecified: Secondary | ICD-10-CM | POA: Diagnosis present

## 2022-10-04 DIAGNOSIS — Z91011 Allergy to milk products: Secondary | ICD-10-CM

## 2022-10-04 DIAGNOSIS — Z6841 Body Mass Index (BMI) 40.0 and over, adult: Secondary | ICD-10-CM

## 2022-10-04 DIAGNOSIS — Z880 Allergy status to penicillin: Secondary | ICD-10-CM

## 2022-10-04 DIAGNOSIS — J45909 Unspecified asthma, uncomplicated: Secondary | ICD-10-CM

## 2022-10-04 DIAGNOSIS — F329 Major depressive disorder, single episode, unspecified: Secondary | ICD-10-CM | POA: Diagnosis present

## 2022-10-04 DIAGNOSIS — Z79899 Other long term (current) drug therapy: Secondary | ICD-10-CM | POA: Diagnosis not present

## 2022-10-04 DIAGNOSIS — J4521 Mild intermittent asthma with (acute) exacerbation: Secondary | ICD-10-CM

## 2022-10-04 DIAGNOSIS — Z7951 Long term (current) use of inhaled steroids: Secondary | ICD-10-CM

## 2022-10-04 DIAGNOSIS — J9601 Acute respiratory failure with hypoxia: Secondary | ICD-10-CM | POA: Diagnosis not present

## 2022-10-04 DIAGNOSIS — Z91010 Allergy to peanuts: Secondary | ICD-10-CM

## 2022-10-04 DIAGNOSIS — Z9851 Tubal ligation status: Secondary | ICD-10-CM | POA: Diagnosis not present

## 2022-10-04 DIAGNOSIS — R0902 Hypoxemia: Secondary | ICD-10-CM

## 2022-10-04 DIAGNOSIS — E119 Type 2 diabetes mellitus without complications: Secondary | ICD-10-CM

## 2022-10-04 DIAGNOSIS — I10 Essential (primary) hypertension: Secondary | ICD-10-CM | POA: Diagnosis present

## 2022-10-04 DIAGNOSIS — F17210 Nicotine dependence, cigarettes, uncomplicated: Secondary | ICD-10-CM | POA: Diagnosis present

## 2022-10-04 DIAGNOSIS — Z7952 Long term (current) use of systemic steroids: Secondary | ICD-10-CM | POA: Diagnosis not present

## 2022-10-04 DIAGNOSIS — J986 Disorders of diaphragm: Secondary | ICD-10-CM | POA: Diagnosis present

## 2022-10-04 DIAGNOSIS — J45901 Unspecified asthma with (acute) exacerbation: Secondary | ICD-10-CM | POA: Diagnosis not present

## 2022-10-04 DIAGNOSIS — F32A Depression, unspecified: Secondary | ICD-10-CM | POA: Insufficient documentation

## 2022-10-04 DIAGNOSIS — R7309 Other abnormal glucose: Secondary | ICD-10-CM | POA: Insufficient documentation

## 2022-10-04 DIAGNOSIS — K219 Gastro-esophageal reflux disease without esophagitis: Secondary | ICD-10-CM | POA: Diagnosis present

## 2022-10-04 DIAGNOSIS — Z1152 Encounter for screening for COVID-19: Secondary | ICD-10-CM

## 2022-10-04 DIAGNOSIS — R7989 Other specified abnormal findings of blood chemistry: Secondary | ICD-10-CM | POA: Insufficient documentation

## 2022-10-04 DIAGNOSIS — J441 Chronic obstructive pulmonary disease with (acute) exacerbation: Principal | ICD-10-CM

## 2022-10-04 LAB — CBC WITH DIFFERENTIAL/PLATELET
Abs Immature Granulocytes: 0.04 K/uL (ref 0.00–0.07)
Basophils Absolute: 0 K/uL (ref 0.0–0.1)
Basophils Relative: 0 %
Eosinophils Absolute: 0 K/uL (ref 0.0–0.5)
Eosinophils Relative: 0 %
HCT: 45.7 % (ref 36.0–46.0)
Hemoglobin: 13.7 g/dL (ref 12.0–15.0)
Immature Granulocytes: 0 %
Lymphocytes Relative: 5 %
Lymphs Abs: 0.5 K/uL — ABNORMAL LOW (ref 0.7–4.0)
MCH: 26.4 pg (ref 26.0–34.0)
MCHC: 30 g/dL (ref 30.0–36.0)
MCV: 88.1 fL (ref 80.0–100.0)
Monocytes Absolute: 0.2 K/uL (ref 0.1–1.0)
Monocytes Relative: 1 %
Neutro Abs: 10.3 K/uL — ABNORMAL HIGH (ref 1.7–7.7)
Neutrophils Relative %: 94 %
Platelets: 264 K/uL (ref 150–400)
RBC: 5.19 MIL/uL — ABNORMAL HIGH (ref 3.87–5.11)
RDW: 13 % (ref 11.5–15.5)
WBC: 11 K/uL — ABNORMAL HIGH (ref 4.0–10.5)
nRBC: 0 % (ref 0.0–0.2)

## 2022-10-04 LAB — BASIC METABOLIC PANEL WITH GFR
Anion gap: 11 (ref 5–15)
BUN: 6 mg/dL (ref 6–20)
CO2: 30 mmol/L (ref 22–32)
Calcium: 9.5 mg/dL (ref 8.9–10.3)
Chloride: 97 mmol/L — ABNORMAL LOW (ref 98–111)
Creatinine, Ser: 0.58 mg/dL (ref 0.44–1.00)
GFR, Estimated: >60 mL/min
Glucose, Bld: 148 mg/dL — ABNORMAL HIGH (ref 70–99)
Potassium: 4.6 mmol/L (ref 3.5–5.1)
Sodium: 138 mmol/L (ref 135–145)

## 2022-10-04 LAB — RESP PANEL BY RT-PCR (RSV, FLU A&B, COVID)  RVPGX2
Influenza A by PCR: NEGATIVE
Influenza B by PCR: NEGATIVE
Resp Syncytial Virus by PCR: NEGATIVE
SARS Coronavirus 2 by RT PCR: NEGATIVE

## 2022-10-04 LAB — BRAIN NATRIURETIC PEPTIDE: B Natriuretic Peptide: 32.4 pg/mL (ref 0.0–100.0)

## 2022-10-04 LAB — POC URINE PREG, ED: Preg Test, Ur: NEGATIVE

## 2022-10-04 LAB — D-DIMER, QUANTITATIVE: D-Dimer, Quant: 0.27 ug{FEU}/mL (ref 0.00–0.50)

## 2022-10-04 MED ORDER — POTASSIUM CHLORIDE 20 MEQ PO PACK
40.0000 meq | PACK | Freq: Once | ORAL | Status: AC
  Administered 2022-10-04: 40 meq via ORAL
  Filled 2022-10-04: qty 2

## 2022-10-04 MED ORDER — ACETAMINOPHEN 325 MG PO TABS: 650.0000 mg | ORAL_TABLET | Freq: Four times a day (QID) | ORAL | Status: DC | PRN

## 2022-10-04 MED ORDER — FESOTERODINE FUMARATE ER 4 MG PO TB24
4.0000 mg | ORAL_TABLET | Freq: Every day | ORAL | Status: DC
  Administered 2022-10-05 – 2022-10-06 (×2): 4 mg via ORAL
  Filled 2022-10-04 (×3): qty 1

## 2022-10-04 MED ORDER — LABETALOL HCL 5 MG/ML IV SOLN: 20.0000 mg | INTRAVENOUS | Status: DC | PRN

## 2022-10-04 MED ORDER — MAGNESIUM HYDROXIDE 400 MG/5ML PO SUSP: 30.0000 mL | Freq: Every day | ORAL | Status: DC | PRN

## 2022-10-04 MED ORDER — PREDNISONE 20 MG PO TABS
40.0000 mg | ORAL_TABLET | Freq: Every day | ORAL | Status: DC
  Administered 2022-10-06: 40 mg via ORAL
  Filled 2022-10-04: qty 2

## 2022-10-04 MED ORDER — ENOXAPARIN SODIUM 80 MG/0.8ML IJ SOSY
0.5000 mg/kg | PREFILLED_SYRINGE | INTRAMUSCULAR | Status: DC
  Administered 2022-10-04 – 2022-10-05 (×2): 67.5 mg via SUBCUTANEOUS
  Filled 2022-10-04 (×2): qty 0.68

## 2022-10-04 MED ORDER — GUAIFENESIN ER 600 MG PO TB12
600.0000 mg | ORAL_TABLET | Freq: Two times a day (BID) | ORAL | Status: DC
  Administered 2022-10-04 – 2022-10-06 (×4): 600 mg via ORAL
  Filled 2022-10-04 (×4): qty 1

## 2022-10-04 MED ORDER — ACETAMINOPHEN 650 MG RE SUPP: 650.0000 mg | Freq: Four times a day (QID) | RECTAL | Status: DC | PRN

## 2022-10-04 MED ORDER — LEVOFLOXACIN IN D5W 750 MG/150ML IV SOLN
750.0000 mg | INTRAVENOUS | Status: DC
  Administered 2022-10-04: 750 mg via INTRAVENOUS
  Filled 2022-10-04: qty 150

## 2022-10-04 MED ORDER — IPRATROPIUM-ALBUTEROL 0.5-2.5 (3) MG/3ML IN SOLN
3.0000 mL | Freq: Once | RESPIRATORY_TRACT | Status: AC
  Administered 2022-10-04: 3 mL via RESPIRATORY_TRACT
  Filled 2022-10-04: qty 3

## 2022-10-04 MED ORDER — HYDRALAZINE HCL 20 MG/ML IJ SOLN: 10.0000 mg | Freq: Four times a day (QID) | INTRAMUSCULAR | Status: DC | PRN

## 2022-10-04 MED ORDER — IPRATROPIUM-ALBUTEROL 0.5-2.5 (3) MG/3ML IN SOLN
3.0000 mL | Freq: Four times a day (QID) | RESPIRATORY_TRACT | Status: DC
  Administered 2022-10-04 – 2022-10-05 (×2): 3 mL via RESPIRATORY_TRACT
  Filled 2022-10-04 (×2): qty 3

## 2022-10-04 MED ORDER — HYDROCOD POLI-CHLORPHE POLI ER 10-8 MG/5ML PO SUER: 5.0000 mL | Freq: Two times a day (BID) | ORAL | Status: DC | PRN

## 2022-10-04 MED ORDER — METHYLPREDNISOLONE SODIUM SUCC 125 MG IJ SOLR
125.0000 mg | Freq: Once | INTRAMUSCULAR | Status: AC
  Administered 2022-10-04: 125 mg via INTRAVENOUS
  Filled 2022-10-04: qty 2

## 2022-10-04 MED ORDER — ENOXAPARIN SODIUM 40 MG/0.4ML IJ SOSY: 40.0000 mg | PREFILLED_SYRINGE | INTRAMUSCULAR | Status: DC

## 2022-10-04 MED ORDER — TRAZODONE HCL 50 MG PO TABS: 25.0000 mg | ORAL_TABLET | Freq: Every evening | ORAL | Status: DC | PRN

## 2022-10-04 MED ORDER — AMLODIPINE BESYLATE 10 MG PO TABS
10.0000 mg | ORAL_TABLET | Freq: Every day | ORAL | Status: DC
  Administered 2022-10-04 – 2022-10-06 (×3): 10 mg via ORAL
  Filled 2022-10-04 (×2): qty 1
  Filled 2022-10-04: qty 2

## 2022-10-04 MED ORDER — PANTOPRAZOLE SODIUM 40 MG PO TBEC
40.0000 mg | DELAYED_RELEASE_TABLET | Freq: Every day | ORAL | Status: DC
  Administered 2022-10-05 – 2022-10-06 (×2): 40 mg via ORAL
  Filled 2022-10-04 (×2): qty 1

## 2022-10-04 MED ORDER — SODIUM CHLORIDE 0.9 % IV SOLN: INTRAVENOUS | Status: DC

## 2022-10-04 MED ORDER — ONDANSETRON HCL 4 MG PO TABS: 4.0000 mg | ORAL_TABLET | Freq: Four times a day (QID) | ORAL | Status: DC | PRN

## 2022-10-04 MED ORDER — ONDANSETRON HCL 4 MG/2ML IJ SOLN: 4.0000 mg | Freq: Four times a day (QID) | INTRAMUSCULAR | Status: DC | PRN

## 2022-10-04 MED ORDER — ESCITALOPRAM OXALATE 10 MG PO TABS
10.0000 mg | ORAL_TABLET | Freq: Every day | ORAL | Status: DC
  Administered 2022-10-05 – 2022-10-06 (×2): 10 mg via ORAL
  Filled 2022-10-04 (×2): qty 1

## 2022-10-04 MED ORDER — METHYLPREDNISOLONE SODIUM SUCC 40 MG IJ SOLR
40.0000 mg | Freq: Two times a day (BID) | INTRAMUSCULAR | Status: AC
  Administered 2022-10-04 – 2022-10-05 (×2): 40 mg via INTRAVENOUS
  Filled 2022-10-04 (×2): qty 1

## 2022-10-04 NOTE — Assessment & Plan Note (Signed)
-   O2 protocol will be followed. - This is clearly secondary to asthma and possibly COPD exacerbation. - Management otherwise as above.

## 2022-10-04 NOTE — ED Triage Notes (Signed)
Pt to ED via [facility] from home. Pt reports SOB that started Friday. Pt reports hx asthma and feels like it is an asthma attack.

## 2022-10-04 NOTE — Assessment & Plan Note (Signed)
-   We will continue Lexapro. 

## 2022-10-04 NOTE — Assessment & Plan Note (Signed)
She was counseled for smoking cessation and will receive further counseling here.

## 2022-10-04 NOTE — Assessment & Plan Note (Signed)
-   Apparently this is subclinical as she is not on hormonal replacement therapy. - Will check TSH level.

## 2022-10-04 NOTE — ED Provider Notes (Signed)
[facility] Provider Note    Event Date/Time   First MD Initiated Contact with Patient 10/04/22 1659     (approximate)  History   Chief Complaint: Shortness of Breath  HPI  Peggy Villa is a 57 y.o. female with a past medical history of asthma, hypertension, obesity, daily smoker, presents to the emergency department for shortness of breath.  Patient states for the past week or so she has been feeling worsening shortness of breath.  Patient went to urgent care was given a prednisone taper which she completed today.  Patient states that shortness of breath has not improved much is in fact somewhat worse.  States shortness of breath worse when she lies flat.  Denies any chest pain but does state some tightness.  Denies any fever.  Does have an occasional dry cough.  States a history of asthma, is a daily smoker but has never been formally diagnosed with COPD.  Physical Exam   Triage Vital Signs: ED Triage Vitals  Enc Vitals Group     BP 10/04/22 1429 (!) 175/116     Pulse Rate 10/04/22 1428 80     Resp 10/04/22 1428 18     Temp 10/04/22 1428 97.8 F (36.6 C)     Temp src --      SpO2 10/04/22 1428 93 %     Weight 10/04/22 1702 (!) 302 lb 0.5 oz (137 kg)     Height 10/04/22 1702 '5\' 7"'$  (1.702 m)     Head Circumference --      Peak Flow --      Pain Score 10/04/22 1428 0     Pain Loc --      Pain Edu? --      Excl. in [place]? --     Most recent vital signs: Vitals:   10/04/22 1428 10/04/22 1429  BP:  (!) 175/116  Pulse: 80   Resp: 18   Temp: 97.8 F (36.6 C)   SpO2: 93%     General: Awake, no distress.  Obese. CV:  Good peripheral perfusion.  Regular rate and rhythm  Resp:  Mild tachypnea.  Patient has mild expiratory wheezes and diminished breath sounds bilaterally. Abd:  No distention.  Soft, nontender.  No rebound or guarding.   ED Results / Procedures / Treatments   EKG  EKG viewed and interpreted by myself shows sinus rhythm at 72  bpm with a narrow QRS, normal axis, normal intervals, nonspecific ST changes.  No ST elevation.  RADIOLOGY  I have reviewed and interpreted the chest x-ray images.  Patient has a high riding left hemidiaphragm. Radiology has read the x-ray as chronic elevation of left diaphragm.   MEDICATIONS ORDERED IN ED: Medications  ipratropium-albuterol (DUONEB) 0.5-2.5 (3) MG/3ML nebulizer solution 3 mL (has no administration in time range)  ipratropium-albuterol (DUONEB) 0.5-2.5 (3) MG/3ML nebulizer solution 3 mL (has no administration in time range)  methylPREDNISolone sodium succinate (SOLU-MEDROL) 125 mg/2 mL injection 125 mg (has no administration in time range)     IMPRESSION / MDM / ASSESSMENT AND PLAN / ED COURSE  I reviewed the triage vital signs and the nursing notes.  Patient's presentation is most consistent with acute presentation with potential threat to life or bodily function.  Patient presents emergency department for worsening shortness of breath over the past 1 week.  Patient states a cough states wheeze and difficulty breathing.  States consistent with prior asthma attacks.  Patient appears somewhat somnolent, does  have diffuse wheeze with diminished breath sounds bilaterally.  We will dose several DuoNebs and start the patient on IV Solu-Medrol.  We will check labs including a D-dimer COVID/flu/RSV and continue to closely monitor.  Differential would include ACS, COPD, asthma exacerbation, pneumonia, infectious etiology such as COVID/flu/RSV.  Patient now satting 88% with a good waveform.  Placed on 2 L nasal cannula.  Patient's lab work is overall reassuring, normal BNP, reassuring chemistry, reassuring CBC.  D-dimer is negative, COVID/RSV/flu test is negative.  Chest x-ray is clear.  Given the patient's wheeze and dyspnea with hypoxia highly suspect more COPD exacerbation.  Patient has received Solu-Medrol and DuoNebs.  Will admit to the hospital service for ongoing workup and  treatment.  Patient agreeable to plan of care.  FINAL CLINICAL IMPRESSION(S) / ED DIAGNOSES   Dyspnea Asthma or COPD exacerbation   Note:  This document was prepared using Dragon voice recognition software and may include unintentional dictation errors.   Harvest Dark, MD 10/04/22 [phone]

## 2022-10-04 NOTE — Progress Notes (Signed)
PHARMACIST - PHYSICIAN COMMUNICATION  CONCERNING:  Enoxaparin (Lovenox) for DVT Prophylaxis    RECOMMENDATION: Patient was prescribed enoxaprin '40mg'$  q24 hours for VTE prophylaxis.   Filed Weights   10/04/22 1702  Weight: (!) 137 kg (302 lb 0.5 oz)    Body mass index is 47.3 kg/m.  Estimated Creatinine Clearance: 115.1 mL/min (by C-G formula based on SCr of 0.58 mg/dL).   Based on [place] patient is candidate for enoxaparin 0.'5mg'$ /kg TBW SQ every 24 hours based on BMI being >30.  Patient is candidate for enoxaparin '30mg'$  every 24 hours based on CrCl <16m/min or Weight <45kg  DESCRIPTION: [facility]  Patient is now receiving enoxaparin 67.5 mg every 24 hours    ADarrick Penna PharmD Clinical Pharmacist  10/04/2022 8:06 PM

## 2022-10-04 NOTE — ED Provider Triage Note (Signed)
  Emergency Medicine Provider Triage Evaluation Note  Peggy Villa , a 57 y.o.female,  was evaluated in triage.  Pt complains of shortness of breath.  She states that she has been feeling short of breath for the past few days.  She was reportedly seen on 09/28/2022 for reported asthma exacerbation, for which she was prescribed prednisone.  Since then, her symptoms have not improved.   Review of Systems  Positive: Shortness of breath. Negative: Denies fever, chest pain, vomiting  Physical Exam   Vitals:   10/04/22 1428  Pulse: 80  Resp: 18  Temp: 97.8 F (36.6 C)  SpO2: 93%   Gen:   Awake, no distress   Resp:  Normal effort  MSK:   Moves extremities without difficulty  Other:    Medical Decision Making  Given the patient's initial medical screening exam, the following diagnostic evaluation has been ordered. The patient will be placed in the appropriate treatment space, once one is available, to complete the evaluation and treatment. I have discussed the plan of care with the patient and I have advised the patient that an ED physician or mid-level practitioner will reevaluate their condition after the test results have been received, as the results may give them additional insight into the type of treatment they may need.    Diagnostics: Labs, respiratory panel, CXR  Treatments: none immediately   Teodoro Spray, [place] 10/04/22 1428

## 2022-10-04 NOTE — Assessment & Plan Note (Signed)
-   This could be associated with a undiagnosed COPD with acute exacerbation. - She will be admitted to a medical telemetry bed. - We will continue steroid therapy with IV Solu-Medrol. - We will continue bronchodilator therapy with DuoNebs 4 times daily and every 4 hours as needed. - We will hold off long-acting beta agonist and Symbicort. - We will add mucolytic therapy. - We will add IV antibiotic therapy with Levaquin given her history of tobacco abuse and the likelihood of underlying COPD in the setting of severe exacerbation and hypoxia.

## 2022-10-04 NOTE — Assessment & Plan Note (Signed)
-   We will continue PPI therapy especially given steroid therapy.

## 2022-10-04 NOTE — Assessment & Plan Note (Signed)
-   We will continue her antihypertensives. - We will place her on as needed IV labetalol.

## 2022-10-04 NOTE — H&P (Signed)
[place]   PATIENT NAME: Peggy Villa    MR#:  [id number]  DATE OF BIRTH:  16-Aug-1966  DATE OF ADMISSION:  10/04/2022  PRIMARY CARE PHYSICIAN: [facility] [place]   Patient is coming from: Home  REQUESTING/REFERRING PHYSICIAN: Harvest Dark, MD  CHIEF COMPLAINT:   Chief Complaint  Patient presents with   Shortness of Breath    HISTORY OF PRESENT ILLNESS:  Peggy Villa is a 57 y.o. female with medical history significant for asthma, hypertension, hypothyroidism, ongoing tobacco abuse and severe obesity, who presented to the emergency room with acute onset of worsening dyspnea with associated dry cough and wheezing for the last couple of days as well as chest tightness.  She denies any fever or chills.  No leg edema or pain or recent travels or surgeries.  No nausea or vomiting or abdominal pain.  No dysuria, oliguria or hematuria or flank pain.  No bleeding diathesis.  No headache or dizziness or blurred vision.  She continues to smoke half a pack of cigarettes per day and has been smoking for a couple of years after quitting for a while.  She has never been diagnosed with COPD however has not had pulmonary function tests before per her report.  ED Course: When she came to the ER BP was 175/116 with otherwise normal vital signs.  Labs revealed mild leukocytosis of 11 with neutrophilia.  Urine pregnancy test was negative.  CMP showed chloride of 97 and glucose of 148.  Influenza antigens, COVID-19 PCR and RSV PCR came back negative. EKG as reviewed by me : EKG showed sinus rhythm with marked sinus arrhythmia with a rate of 72 with T wave inversion laterally Imaging: Two-view chest x-ray showed chronic elevation of the left diaphragm with probable atelectasis at the bases.  The patient was given 2 DuoNebs and 125 mg of IV Solu-Medrol.  She will be admitted to a medical telemetry bed for further evaluation and management. PAST MEDICAL HISTORY:   Past  Medical History:  Diagnosis Date   Asthma    Essential hypertension 09/11/2014   Hypertension    Hypothyroidism 09/11/2014   Obesity    Shoulder pain 12/02/2016    PAST SURGICAL HISTORY:   Past Surgical History:  Procedure Laterality Date   BREAST REDUCTION SURGERY     REDUCTION MAMMAPLASTY Bilateral 2006   TUBAL LIGATION      SOCIAL HISTORY:   Social History   Tobacco Use   Smoking status: Former    Packs/day: 0.25    Types: Cigarettes   Smokeless tobacco: Never  Substance Use Topics   Alcohol use: Yes    Comment: socially    FAMILY HISTORY:   Family History  Problem Relation Age of Onset   Lupus Mother    Cancer Father    Breast cancer Neg Hx    Prostate cancer Neg Hx    Bladder Cancer Neg Hx    Kidney cancer Neg Hx     DRUG ALLERGIES:   Allergies  Allergen Reactions   Milk Protein Anaphylaxis   Penicillin G Swelling   Milk-Related Compounds    Other    Peanut-Containing Drug Products    Penicillins     REVIEW OF SYSTEMS:   ROS As per history of present illness. All pertinent systems were reviewed above. Constitutional, HEENT, cardiovascular, respiratory, GI, GU, musculoskeletal, neuro, psychiatric, endocrine, integumentary and hematologic systems were reviewed and are otherwise negative/unremarkable except for positive findings mentioned above in  the HPI.   MEDICATIONS AT HOME:   Prior to Admission medications   Medication Sig Start Date End Date Taking? Authorizing Provider  predniSONE (DELTASONE) 20 MG tablet Take 20 mg by mouth daily with breakfast. 09/28/22 10/05/22 Yes [provider]  albuterol (PROVENTIL HFA;VENTOLIN HFA) 108 (90 Base) MCG/ACT inhaler Inhale 2-4 puffs by mouth every 4 hours as needed for wheezing, cough, and/or shortness of breath 12/26/18   Hinda Kehr, MD  amLODipine (NORVASC) 10 MG tablet Take 10 mg by mouth daily. 04/12/22   [provider]  escitalopram (LEXAPRO) 10 MG tablet Take 10 mg by mouth daily.     [provider]  hydrocortisone 1 % ointment Apply 1 Application topically 2 (two) times daily. 08/15/22   Melynda Ripple, MD  Miconazole Nitrate 2 % OINT Apply 1 Application topically 2 (two) times daily. Apply until rash is gone and for 1 week thereafter 08/15/22   Melynda Ripple, MD  pantoprazole (PROTONIX) 40 MG tablet Take 40 mg by mouth daily. 04/12/22   [provider]  solifenacin (VESICARE) 10 MG tablet Take 10 mg by mouth daily. 04/12/22   [provider]  SYMBICORT 160-4.5 MCG/ACT inhaler Inhale into the lungs. 01/28/22   [provider]      VITAL SIGNS:  Blood pressure (!) 170/100, pulse 88, temperature 98 F (36.7 C), temperature source Oral, resp. rate 18, height '5\' 7"'$  (1.702 m), weight (!) 137 kg, last menstrual period 02/01/2016, SpO2 94 %.  PHYSICAL EXAMINATION:  Physical Exam  GENERAL:  57 y.o.-year-old African-American female patient semi-lying in the bed with mild respiratory distress with conversational dyspnea. EYES: Pupils equal, round, reactive to light and accommodation. No scleral icterus. Extraocular muscles intact.  HEENT: Head atraumatic, normocephalic. Oropharynx and nasopharynx clear.  NECK:  Supple, no jugular venous distention. No thyroid enlargement, no tenderness.  LUNGS: Diffuse expiratory wheezes with tight expiratory airflow and harsh vesicular breathing.  No use of accessory muscles of respiration.  CARDIOVASCULAR: Regular rate and rhythm, S1, S2 normal. No murmurs, rubs, or gallops.  ABDOMEN: Soft, nondistended, nontender. Bowel sounds present. No organomegaly or mass.  EXTREMITIES: No pedal edema, cyanosis, or clubbing.  NEUROLOGIC: Cranial nerves II through XII are intact. Muscle strength 5/5 in all extremities. Sensation intact. Gait not checked.  PSYCHIATRIC: The patient is alert and oriented x 3.  Normal affect and good eye contact. SKIN: No obvious rash, lesion, or ulcer.   LABORATORY PANEL:    CBC Recent Labs  Lab 10/04/22 1547  WBC 11.0*  HGB 13.7  HCT 45.7  PLT 264   ------------------------------------------------------------------------------------------------------------------  Chemistries  Recent Labs  Lab 10/04/22 1547  NA 138  K 4.6  CL 97*  CO2 30  GLUCOSE 148*  BUN 6  CREATININE 0.58  CALCIUM 9.5   ------------------------------------------------------------------------------------------------------------------  Cardiac Enzymes No results for input(s): "TROPONINI" in the last 168 hours. ------------------------------------------------------------------------------------------------------------------  RADIOLOGY:  DG Chest 2 View  Result Date: 10/04/2022 CLINICAL DATA:  Shortness of breath EXAM: CHEST - 2 VIEW COMPARISON:  12/26/2018, 06/24/2020 FINDINGS: Chronic elevation of the left diaphragm with probable atelectasis at left base. Streaky atelectasis right base. Stable cardiomediastinal silhouette. No pleural effusion or pneumothorax. IMPRESSION: Chronic elevation of the left diaphragm with probable atelectasis at the bases. Electronically Signed   By: Donavan Foil M.D.   On: 10/04/2022 15:10      IMPRESSION AND PLAN:  Assessment and Plan: * Asthma exacerbation - This could be associated with a undiagnosed COPD with acute exacerbation. -  She will be admitted to a medical telemetry bed. - We will continue steroid therapy with IV Solu-Medrol. - We will continue bronchodilator therapy with DuoNebs 4 times daily and every 4 hours as needed. - We will hold off long-acting beta agonist and Symbicort. - We will add mucolytic therapy. - We will add IV antibiotic therapy with Levaquin given her history of tobacco abuse and the likelihood of underlying COPD in the setting of severe exacerbation and hypoxia.   Acute respiratory failure with hypoxia (HCC) - O2 protocol will be followed. - This is clearly secondary to asthma and possibly COPD  exacerbation. - Management otherwise as above.  Hypertensive urgency - We will continue her antihypertensives. - We will place her on as needed IV labetalol.  Tobacco abuse She was counseled for smoking cessation and will receive further counseling here.  Depression - We will continue Lexapro.  GERD without esophagitis - We will continue PPI therapy especially given steroid therapy.  Hypothyroidism - Apparently this is subclinical as she is not on hormonal replacement therapy. - Will check TSH level.   DVT prophylaxis: Lovenox.  Advanced Care Planning:  Code Status: full code.  Family Communication:  The plan of care was discussed in details with the patient (and family). I answered all questions. The patient agreed to proceed with the above mentioned plan. Further management will depend upon hospital course. Disposition Plan: Back to previous home environment Consults called: none.  All the records are reviewed and case discussed with ED provider.  Status is: Inpatient   At the time of the admission, it appears that the appropriate admission status for this patient is inpatient.  This is judged to be reasonable and necessary in order to provide the required intensity of service to ensure the patient's safety given the presenting symptoms, physical exam findings and initial radiographic and laboratory data in the context of comorbid conditions.  The patient requires inpatient status due to high intensity of service, high risk of further deterioration and high frequency of surveillance required.  I certify that at the time of admission, it is my clinical judgment that the patient will require inpatient hospital care extending more than 2 midnights.                            Dispo: The patient is from: Home              Anticipated d/c is to: Home              Patient currently is not medically stable to d/c.              Difficult to place patient: No  Christel Mormon M.D on  10/04/2022 at 8:07 PM  [facility]   From 7 PM-7 AM, contact night-coverage [email or web address]  CC: Primary care physician; [facility]

## 2022-10-04 NOTE — ED Notes (Signed)
Pt given sandwich and ginger ale per request

## 2022-10-05 ENCOUNTER — Inpatient Hospital Stay: Payer: BC Managed Care – PPO

## 2022-10-05 DIAGNOSIS — J45901 Unspecified asthma with (acute) exacerbation: Secondary | ICD-10-CM

## 2022-10-05 LAB — BASIC METABOLIC PANEL
Anion gap: 9 (ref 5–15)
BUN: 7 mg/dL (ref 6–20)
CO2: 28 mmol/L (ref 22–32)
Calcium: 8.5 mg/dL — ABNORMAL LOW (ref 8.9–10.3)
Chloride: 103 mmol/L (ref 98–111)
Creatinine, Ser: 0.48 mg/dL (ref 0.44–1.00)
GFR, Estimated: >60 mL/min (ref >60)
Glucose, Bld: 148 mg/dL — ABNORMAL HIGH (ref 70–99)
Potassium: 5 mmol/L (ref 3.5–5.1)
Sodium: 140 mmol/L (ref 135–145)

## 2022-10-05 LAB — CBC
HCT: 42 % (ref 36.0–46.0)
Hemoglobin: 12.2 g/dL (ref 12.0–15.0)
MCH: 26 pg (ref 26.0–34.0)
MCHC: 29 g/dL — ABNORMAL LOW (ref 30.0–36.0)
MCV: 89.4 fL (ref 80.0–100.0)
Platelets: 207 10*3/uL (ref 150–400)
RBC: 4.7 MIL/uL (ref 3.87–5.11)
RDW: 12.8 % (ref 11.5–15.5)
WBC: 10.4 10*3/uL (ref 4.0–10.5)
nRBC: 0 % (ref 0.0–0.2)

## 2022-10-05 LAB — HIV ANTIBODY (ROUTINE TESTING W REFLEX): HIV Screen 4th Generation wRfx: NONREACTIVE

## 2022-10-05 LAB — TSH: TSH: 0.222 u[IU]/mL — ABNORMAL LOW (ref 0.350–4.500)

## 2022-10-05 MED ORDER — ALBUTEROL SULFATE (2.5 MG/3ML) 0.083% IN NEBU: 2.5000 mg | INHALATION_SOLUTION | RESPIRATORY_TRACT | Status: DC | PRN

## 2022-10-05 MED ORDER — IPRATROPIUM-ALBUTEROL 0.5-2.5 (3) MG/3ML IN SOLN
3.0000 mL | Freq: Four times a day (QID) | RESPIRATORY_TRACT | Status: DC
  Administered 2022-10-05 – 2022-10-06 (×4): 3 mL via RESPIRATORY_TRACT
  Filled 2022-10-05 (×4): qty 3

## 2022-10-05 NOTE — Progress Notes (Signed)
PROGRESS NOTE    Peggy Villa  LNL:[id number] DOB: 1966/07/11 DOA: 10/04/2022 PCP: [facility] [place]     Brief Narrative:   From admission h and p  Peggy Villa is a 57 y.o. female with medical history significant for asthma, hypertension, hypothyroidism, ongoing tobacco abuse and severe obesity, who presented to the emergency room with acute onset of worsening dyspnea with associated dry cough and wheezing for the last couple of days as well as chest tightness.  She denies any fever or chills.  No leg edema or pain or recent travels or surgeries.  No nausea or vomiting or abdominal pain.  No dysuria, oliguria or hematuria or flank pain.  No bleeding diathesis.  No headache or dizziness or blurred vision.  She continues to smoke half a pack of cigarettes per day and has been smoking for a couple of years after quitting for a while.  She has never been diagnosed with COPD however has not had pulmonary function tests before per her report.    Assessment & Plan:   Principal Problem:   Asthma exacerbation Active Problems:   Acute respiratory failure with hypoxia (HCC)   Hypertensive urgency   Tobacco abuse   Essential hypertension   Hypothyroidism   GERD without esophagitis   Depression  # Acute hypoxic respiratory failure O2 80s off oxygen including this morning - [place] O2, wean as able  # Asthma with exacerbation Bnp wnl, dimer wnl, cxr no focal infiltrate. Symptomatically improving with steroids and breathing treatments but still hypoxic - continue steroids, duonebs - stop abx, no dx of copd, no productive cough  # MDD - home lexapro  # HTN Here bp elevated - home amlodipine - stop fluids - consider adding 2nd agent - labetalol prn  # Hypothyroid? - f/u tsh  # Hyperglycemia Mild - f/u A1c  # Chronic left elevated hemidiaphragm May contribute to respiratory symptoms - f/u sniff test   DVT prophylaxis: lovenox Code Status: full code Family  Communication: none @ bedside  Level of care: Telemetry Medical Status is: Inpatient Remains inpatient appropriate because: need for supplemental o2    Consultants:  none  Procedures: none  Antimicrobials:  S/p levofloxacin    Subjective: Breathing improved, feeling improved  Objective: Vitals:   10/04/22 1836 10/04/22 2231 10/05/22 0650 10/05/22 0800  BP:  (!) 164/97  (!) 166/103  Pulse:  86  83  Resp:  20  17  Temp: 98 F (36.7 C) 98.4 F (36.9 C)  98.1 F (36.7 C)  TempSrc: Oral Oral  Oral  SpO2:  95% 96% 92%  Weight:      Height:        Intake/Output Summary (Last 24 hours) at 10/05/2022 0845 Last data filed at 10/05/2022 0746 Gross per 24 hour  Intake 1970.45 ml  Output --  Net 1970.45 ml   Filed Weights   10/04/22 1702  Weight: (!) 137 kg    Examination:  General exam: Appears calm and comfortable  Respiratory system: Clear to auscultation. Respiratory effort normal. Decreased sounds left lung base Cardiovascular system: S1 & S2 heard, RRR. No JVD, murmurs, rubs, gallops or clicks. No pedal edema. Gastrointestinal system: Abdomen is obese, soft and nontender. No organomegaly or masses felt. Normal bowel sounds heard. Central nervous system: Alert and oriented. No focal neurological deficits. Extremities: Symmetric 5 x 5 power. Skin: No rashes, lesions or ulcers Psychiatry: Judgement and insight appear normal. Mood & affect appropriate.     Data  Reviewed: I have personally reviewed following labs and imaging studies  CBC: Recent Labs  Lab 10/04/22 1547 10/05/22 0645  WBC 11.0* 10.4  NEUTROABS 10.3*  --   HGB 13.7 12.2  HCT 45.7 42.0  MCV 88.1 89.4  PLT 264 858   Basic Metabolic Panel: Recent Labs  Lab 10/04/22 1547 10/05/22 0645  NA 138 140  K 4.6 5.0  CL 97* 103  CO2 30 28  GLUCOSE 148* 148*  BUN 6 7  CREATININE 0.58 0.48  CALCIUM 9.5 8.5*   GFR: Estimated Creatinine Clearance: 115.1 mL/min (by C-G formula based on SCr of  0.48 mg/dL). Liver Function Tests: No results for input(s): "AST", "ALT", "ALKPHOS", "BILITOT", "PROT", "ALBUMIN" in the last 168 hours. No results for input(s): "LIPASE", "AMYLASE" in the last 168 hours. No results for input(s): "AMMONIA" in the last 168 hours. Coagulation Profile: No results for input(s): "INR", "PROTIME" in the last 168 hours. Cardiac Enzymes: No results for input(s): "CKTOTAL", "CKMB", "CKMBINDEX", "TROPONINI" in the last 168 hours. BNP (last 3 results) No results for input(s): "PROBNP" in the last 8760 hours. HbA1C: No results for input(s): "HGBA1C" in the last 72 hours. CBG: No results for input(s): "GLUCAP" in the last 168 hours. Lipid Profile: No results for input(s): "CHOL", "HDL", "LDLCALC", "TRIG", "CHOLHDL", "LDLDIRECT" in the last 72 hours. Thyroid Function Tests: No results for input(s): "TSH", "T4TOTAL", "FREET4", "T3FREE", "THYROIDAB" in the last 72 hours. Anemia Panel: No results for input(s): "VITAMINB12", "FOLATE", "FERRITIN", "TIBC", "IRON", "RETICCTPCT" in the last 72 hours. Urine analysis:    Component Value Date/Time   COLORURINE YELLOW 08/15/2022 1559   APPEARANCEUR CLOUDY (A) 08/15/2022 1559   LABSPEC 1.020 08/15/2022 1559   PHURINE 5.5 08/15/2022 1559   GLUCOSEU NEGATIVE 08/15/2022 1559   HGBUR NEGATIVE 08/15/2022 1559   BILIRUBINUR SMALL (A) 08/15/2022 1559   KETONESUR 15 (A) 08/15/2022 1559   PROTEINUR NEGATIVE 08/15/2022 1559   NITRITE NEGATIVE 08/15/2022 1559   LEUKOCYTESUR NEGATIVE 08/15/2022 1559   Sepsis Labs: '@LABRCNTIP'$ (procalcitonin:4,lacticidven:4)  ) Recent Results (from the past 240 hour(s))  Resp panel by RT-PCR (RSV, Flu A&B, Covid) Anterior Nasal Swab     Status: None   Collection Time: 10/04/22  2:19 PM   Specimen: Anterior Nasal Swab  Result Value Ref Range Status   SARS Coronavirus 2 by RT PCR NEGATIVE NEGATIVE Final   Influenza A by PCR NEGATIVE NEGATIVE Final   Influenza B by PCR NEGATIVE NEGATIVE Final    Resp Syncytial Virus by PCR NEGATIVE NEGATIVE Final    Comment: Performed at [facility], [address], [place], [place] [address]         Radiology Studies: DG Chest 2 View  Result Date: 10/04/2022 CLINICAL DATA:  Shortness of breath EXAM: CHEST - 2 VIEW COMPARISON:  12/26/2018, 06/24/2020 FINDINGS: Chronic elevation of the left diaphragm with probable atelectasis at left base. Streaky atelectasis right base. Stable cardiomediastinal silhouette. No pleural effusion or pneumothorax. IMPRESSION: Chronic elevation of the left diaphragm with probable atelectasis at the bases. Electronically Signed   By: Donavan Foil M.D.   On: 10/04/2022 15:10        Scheduled Meds:  amLODipine  10 mg Oral Daily   enoxaparin (LOVENOX) injection  0.5 mg/kg Subcutaneous Q24H   escitalopram  10 mg Oral Daily   fesoterodine  4 mg Oral Daily   guaiFENesin  600 mg Oral BID   ipratropium-albuterol  3 mL Nebulization QID   methylPREDNISolone (SOLU-MEDROL) injection  40 mg Intravenous  Q12H   Followed by   Derrill Memo ON 10/06/2022] predniSONE  40 mg Oral Q breakfast   pantoprazole  40 mg Oral Daily   Continuous Infusions:  sodium chloride 100 mL/hr at 10/05/22 0823   levofloxacin (LEVAQUIN) IV Stopped (10/05/22 0019)     LOS: 1 day     Desma Maxim, MD [facility]   If 7PM-7AM, please contact night-coverage [email or web address] Password [email or web address] 10/05/2022, 8:45 AM

## 2022-10-05 NOTE — ED Notes (Signed)
Pt to xray

## 2022-10-06 DIAGNOSIS — R7989 Other specified abnormal findings of blood chemistry: Secondary | ICD-10-CM | POA: Insufficient documentation

## 2022-10-06 DIAGNOSIS — R7309 Other abnormal glucose: Secondary | ICD-10-CM | POA: Insufficient documentation

## 2022-10-06 DIAGNOSIS — E119 Type 2 diabetes mellitus without complications: Secondary | ICD-10-CM

## 2022-10-06 DIAGNOSIS — J45901 Unspecified asthma with (acute) exacerbation: Secondary | ICD-10-CM | POA: Diagnosis not present

## 2022-10-06 DIAGNOSIS — J986 Disorders of diaphragm: Secondary | ICD-10-CM | POA: Insufficient documentation

## 2022-10-06 LAB — BASIC METABOLIC PANEL
Anion gap: 8 (ref 5–15)
BUN: 11 mg/dL (ref 6–20)
CO2: 34 mmol/L — ABNORMAL HIGH (ref 22–32)
Calcium: 9.2 mg/dL (ref 8.9–10.3)
Chloride: 96 mmol/L — ABNORMAL LOW (ref 98–111)
Creatinine, Ser: 0.61 mg/dL (ref 0.44–1.00)
GFR, Estimated: >60 mL/min (ref >60)
Glucose, Bld: 110 mg/dL — ABNORMAL HIGH (ref 70–99)
Potassium: 4.4 mmol/L (ref 3.5–5.1)
Sodium: 138 mmol/L (ref 135–145)

## 2022-10-06 LAB — HEMOGLOBIN A1C
Hgb A1c MFr Bld: 6.5 % — ABNORMAL HIGH (ref 4.8–5.6)
Mean Plasma Glucose: 140 mg/dL

## 2022-10-06 LAB — T4, FREE: Free T4: 0.85 ng/dL (ref 0.61–1.12)

## 2022-10-06 MED ORDER — PREDNISONE 20 MG PO TABS: 40.0000 mg | ORAL_TABLET | Freq: Every day | ORAL | 0 refills | Status: AC

## 2022-10-06 MED ORDER — IPRATROPIUM-ALBUTEROL 0.5-2.5 (3) MG/3ML IN SOLN: 3.0000 mL | Freq: Two times a day (BID) | RESPIRATORY_TRACT | Status: DC

## 2022-10-06 NOTE — Progress Notes (Signed)
Mobility Specialist - Progress Note   10/06/22 1106  Mobility  Activity Ambulated with assistance in hallway;Stood at bedside;Dangled on edge of bed  Level of Assistance Standby assist, set-up cues, supervision of patient - no hands on  Assistive Device None  Distance Ambulated (ft) 80 ft  Activity Response Tolerated well  Mobility Referral Yes  $Mobility charge 1 Mobility    Pt EOB on RA upon arrival. Pt STS and ambulates in hall Supervision. Pt SPO2 desats to 85 at end of ambulation. RN notified. Pt left EOB with needs in reach and RN in room.   On Room Air  Post-mobility: HR = 87 SPO2 = [place] Specialist  10/06/22 11:08 AM

## 2022-10-06 NOTE — Discharge Summary (Signed)
Peggy Villa:[id number] DOB: [birth year] DOA: 10/04/2022  PCP: [facility] [place] date: 10/04/2022 Discharge date: 10/06/2022  Time spent: 35 minutes  Recommendations for Outpatient Follow-up:  Pulmonology f/u F/u T4 result (pending at time of discharge) New dx t2dm needs f/u    Discharge Diagnoses:  Principal Problem:   Asthma exacerbation Active Problems:   Acute respiratory failure with hypoxia ([place])   Hypertensive urgency   Tobacco abuse   Essential hypertension   Hypothyroidism   GERD without esophagitis   Depression   Elevated hemoglobin A1c   Low TSH level   Diaphragmatic paralysis   Discharge Condition: improved  Diet recommendation: heart healthy  Filed Weights   10/04/22 1702  Weight: (!) 137 kg    History of present illness:  From admission h and p Peggy Villa is a 57 y.o. female with medical history significant for asthma, hypertension, hypothyroidism, ongoing tobacco abuse and severe obesity, who presented to the emergency room with acute onset of worsening dyspnea with associated dry cough and wheezing for the last couple of days as well as chest tightness.  She denies any fever or chills.  No leg edema or pain or recent travels or surgeries.  No nausea or vomiting or abdominal pain.  No dysuria, oliguria or hematuria or flank pain.  No bleeding diathesis.  No headache or dizziness or blurred vision.  She continues to smoke half a pack of cigarettes per day and has been smoking for a couple of years after quitting for a while.  She has never been diagnosed with COPD however has not had pulmonary function tests before per her report.   Hospital Course:  Patient presented with dyspnea. She was diagnosed with asthma exacerbation and treated with steroids. She was hypoxic on presentation to the 80s and treated with o2. Symptomatically improved. She was weaned off o2 at rest but did drop to the upper 80s with ambulation. However she  reports no problems with ambulation and declines home o2. She will complete a course of oral steroids. Elevated of left hemidiaphragm noted on cxr that was otherwise clear, sniff test reveals paralysis of the left hemidiaphragm. This likely contributes to her sensation of dyspnea. Do not think PE or CHF as lungs clear, bnp wnl, dimer wnl. Advise pulmonology f/u and referral was placed. 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 Of note tsh also mildly low 0.2, t4 pending at time of discharge.   Procedures: none   Consultations: none  Discharge Exam: Vitals:   10/06/22 0855 10/06/22 0900  BP:    Pulse:    Resp:    Temp:    SpO2: 97% 92%    General: NAD Cardiovascular: RRR Respiratory: clear  Discharge Instructions   Discharge Instructions     Ambulatory referral to Pulmonology   Complete by: As directed    Asthma, diaphragmatic paralysis   Reason for referral: Other   Diet - low sodium heart healthy   Complete by: As directed    Increase activity slowly   Complete by: As directed       Allergies as of 10/06/2022       Reactions   Milk Protein Anaphylaxis   Penicillin G Swelling   Milk-related Compounds    Other    Peanut-containing Drug Products    Penicillins         Medication List     TAKE these medications  albuterol 108 (90 Base) MCG/ACT inhaler Commonly known as: VENTOLIN HFA Inhale 2-4 puffs by mouth every 4 hours as needed for wheezing, cough, and/or shortness of breath   amLODipine 10 MG tablet Commonly known as: NORVASC Take 10 mg by mouth daily.   escitalopram 10 MG tablet Commonly known as: LEXAPRO Take 10 mg by mouth daily.   hydrocortisone 1 % ointment Apply 1 Application topically 2 (two) times daily.   Miconazole Nitrate 2 % Oint Apply 1 Application topically 2 (two) times daily. Apply until rash is gone and for 1 week thereafter   pantoprazole 40 MG tablet Commonly known as:  PROTONIX Take 40 mg by mouth daily.   predniSONE 20 MG tablet Commonly known as: DELTASONE Take 2 tablets (40 mg total) by mouth daily with breakfast for 3 days. Start taking on: October 07, 2022 What changed: how much to take   solifenacin 10 MG tablet Commonly known as: VESICARE Take 10 mg by mouth daily.   Symbicort 160-4.5 MCG/ACT inhaler Generic drug: budesonide-formoterol Inhale 2 puffs into the lungs 2 (two) times daily.       Allergies  Allergen Reactions   Milk Protein Anaphylaxis   Penicillin G Swelling   Milk-Related Compounds    Other    Peanut-Containing Drug Products    Penicillins     [place] Follow up.   Specialty: General Practice Contact information: [place] [address] [phone]                  The results of significant diagnostics from this hospitalization (including imaging, microbiology, ancillary and laboratory) are listed below for reference.    Significant Diagnostic Studies: DG Sniff Test  Result Date: 10/05/2022 CLINICAL DATA:  Chronic left elevated hemidiaphragm. Worsening dyspnea, cough, wheezing EXAM: CHEST FLUOROSCOPY TECHNIQUE: Real-time fluoroscopic evaluation of the chest was performed. FLUOROSCOPY: Radiation Exposure Index (as provided by the fluoroscopic device): 25.30 mGy Kerma COMPARISON:  Chest radiographs 10/04/2022, 06/24/2020, 12/26/2018 FINDINGS: Chronic elevation of the left diaphragm. Normal right diaphragmatic motion during inspiration, expiration and sniffing. No left significant diaphragmatic motion during inspiration, expiration and sniffing. There is normal inferior motion of the hemidiaphragms with inspiration. No paradoxical motion. IMPRESSION: Chronic elevation of the left diaphragm with findings consistent with left diaphragmatic paralysis. Electronically Signed   By: Kathreen Devoid M.D.   On: 10/05/2022 10:23   DG Chest 2 View  Result  Date: 10/04/2022 CLINICAL DATA:  Shortness of breath EXAM: CHEST - 2 VIEW COMPARISON:  12/26/2018, 06/24/2020 FINDINGS: Chronic elevation of the left diaphragm with probable atelectasis at left base. Streaky atelectasis right base. Stable cardiomediastinal silhouette. No pleural effusion or pneumothorax. IMPRESSION: Chronic elevation of the left diaphragm with probable atelectasis at the bases. Electronically Signed   By: Donavan Foil M.D.   On: 10/04/2022 15:10    Microbiology: Recent Results (from the past 240 hour(s))  Resp panel by RT-PCR (RSV, Flu A&B, Covid) Anterior Nasal Swab     Status: None   Collection Time: 10/04/22  2:19 PM   Specimen: Anterior Nasal Swab  Result Value Ref Range Status   SARS Coronavirus 2 by RT PCR NEGATIVE NEGATIVE Final   Influenza A by PCR NEGATIVE NEGATIVE Final   Influenza B by PCR NEGATIVE NEGATIVE Final   Resp Syncytial Virus by PCR NEGATIVE NEGATIVE Final    Comment: Performed at [facility], [address], [place], [place] [address]  Labs: Basic Metabolic Panel: Recent Labs  Lab 10/04/22 1547 10/05/22 0645 10/06/22 0604  NA 138 140 138  K 4.6 5.0 4.4  CL 97* 103 96*  CO2 30 28 34*  GLUCOSE 148* 148* 110*  BUN '6 7 11  '$ CREATININE 0.58 0.48 0.61  CALCIUM 9.5 8.5* 9.2   Liver Function Tests: No results for input(s): "AST", "ALT", "ALKPHOS", "BILITOT", "PROT", "ALBUMIN" in the last 168 hours. No results for input(s): "LIPASE", "AMYLASE" in the last 168 hours. No results for input(s): "AMMONIA" in the last 168 hours. CBC: Recent Labs  Lab 10/04/22 1547 10/05/22 0645  WBC 11.0* 10.4  NEUTROABS 10.3*  --   HGB 13.7 12.2  HCT 45.7 42.0  MCV 88.1 89.4  PLT 264 207   Cardiac Enzymes: No results for input(s): "CKTOTAL", "CKMB", "CKMBINDEX", "TROPONINI" in the last 168 hours. BNP: BNP (last 3 results) Recent Labs    10/04/22 1547  BNP 32.4    ProBNP (last 3 results) No results for input(s): "PROBNP" in the last 8760  hours.  CBG: No results for input(s): "GLUCAP" in the last 168 hours.     Signed:  Desma Maxim MD.  [facility] 10/06/2022, 11:28 AM

## 2022-10-06 NOTE — Progress Notes (Signed)
Removed [place] at 0815 and checked Spo2 at 0900 RA at rest 97% and while ambulating 92%

## 2022-10-06 NOTE — TOC CM/SW Note (Signed)
  Transition of Care [facility]) Screening Note   Patient Details  Name: Peggy Villa Date of Birth: [birth year]   Transition of Care [facility]) CM/SW Contact:    Gerilyn Pilgrim, LCSW Phone Number: 10/06/2022, 11:59 AM    Transition of Care Department [facility]) has reviewed patient and no TOC needs have been identified at this time. We will continue to monitor patient advancement through interdisciplinary progression rounds. If new patient transition needs arise, please place a TOC consult.

## 2022-10-12 ENCOUNTER — Emergency Department: Payer: BC Managed Care – PPO

## 2022-10-12 ENCOUNTER — Other Ambulatory Visit: Payer: Self-pay

## 2022-10-12 ENCOUNTER — Emergency Department
Admission: EM | Admit: 2022-10-12 | Discharge: 2022-10-12 | Disposition: A | Discharge status: Home / Self Care | Payer: BC Managed Care – PPO | Attending: Emergency Medicine | Admitting: Emergency Medicine

## 2022-10-12 DIAGNOSIS — J45901 Unspecified asthma with (acute) exacerbation: Secondary | ICD-10-CM | POA: Insufficient documentation

## 2022-10-12 DIAGNOSIS — Z87891 Personal history of nicotine dependence: Secondary | ICD-10-CM | POA: Insufficient documentation

## 2022-10-12 DIAGNOSIS — Z7951 Long term (current) use of inhaled steroids: Secondary | ICD-10-CM | POA: Insufficient documentation

## 2022-10-12 DIAGNOSIS — R0902 Hypoxemia: Secondary | ICD-10-CM

## 2022-10-12 DIAGNOSIS — I10 Essential (primary) hypertension: Secondary | ICD-10-CM | POA: Insufficient documentation

## 2022-10-12 DIAGNOSIS — E039 Hypothyroidism, unspecified: Secondary | ICD-10-CM | POA: Diagnosis not present

## 2022-10-12 DIAGNOSIS — R0602 Shortness of breath: Secondary | ICD-10-CM | POA: Insufficient documentation

## 2022-10-12 LAB — BLOOD GAS, VENOUS
Acid-Base Excess: 11.1 mmol/L — ABNORMAL HIGH (ref 0.0–2.0)
Bicarbonate: 40.7 mmol/L — ABNORMAL HIGH (ref 20.0–28.0)
O2 Saturation: 87.5 %
Patient temperature: 37
pCO2, Ven: 79 mmHg (ref 44–60)
pH, Ven: 7.32 (ref 7.25–7.43)
pO2, Ven: 56 mmHg — ABNORMAL HIGH (ref 32–45)

## 2022-10-12 LAB — CBC WITH DIFFERENTIAL/PLATELET
Abs Immature Granulocytes: 0.05 10*3/uL (ref 0.00–0.07)
Basophils Absolute: 0 10*3/uL (ref 0.0–0.1)
Basophils Relative: 0 %
Eosinophils Absolute: 0.1 10*3/uL (ref 0.0–0.5)
Eosinophils Relative: 0 %
HCT: 45.6 % (ref 36.0–46.0)
Hemoglobin: 13.4 g/dL (ref 12.0–15.0)
Immature Granulocytes: 0 %
Lymphocytes Relative: 15 %
Lymphs Abs: 1.8 10*3/uL (ref 0.7–4.0)
MCH: 25.9 pg — ABNORMAL LOW (ref 26.0–34.0)
MCHC: 29.4 g/dL — ABNORMAL LOW (ref 30.0–36.0)
MCV: 88 fL (ref 80.0–100.0)
Monocytes Absolute: 0.7 10*3/uL (ref 0.1–1.0)
Monocytes Relative: 6 %
Neutro Abs: 9.2 10*3/uL — ABNORMAL HIGH (ref 1.7–7.7)
Neutrophils Relative %: 79 %
Platelets: 237 10*3/uL (ref 150–400)
RBC: 5.18 MIL/uL — ABNORMAL HIGH (ref 3.87–5.11)
RDW: 13.1 % (ref 11.5–15.5)
WBC: 11.8 10*3/uL — ABNORMAL HIGH (ref 4.0–10.5)
nRBC: 0 % (ref 0.0–0.2)

## 2022-10-12 LAB — COMPREHENSIVE METABOLIC PANEL
ALT: 12 U/L (ref 0–44)
AST: 11 U/L — ABNORMAL LOW (ref 15–41)
Albumin: 3.4 g/dL — ABNORMAL LOW (ref 3.5–5.0)
Alkaline Phosphatase: 52 U/L (ref 38–126)
Anion gap: 9 (ref 5–15)
BUN: 10 mg/dL (ref 6–20)
CO2: 32 mmol/L (ref 22–32)
Calcium: 8.8 mg/dL — ABNORMAL LOW (ref 8.9–10.3)
Chloride: 99 mmol/L (ref 98–111)
Creatinine, Ser: 0.62 mg/dL (ref 0.44–1.00)
GFR, Estimated: >60 mL/min (ref >60)
Glucose, Bld: 148 mg/dL — ABNORMAL HIGH (ref 70–99)
Potassium: 3.4 mmol/L — ABNORMAL LOW (ref 3.5–5.1)
Sodium: 140 mmol/L (ref 135–145)
Total Bilirubin: 0.5 mg/dL (ref 0.3–1.2)
Total Protein: 7.1 g/dL (ref 6.5–8.1)

## 2022-10-12 LAB — TROPONIN I (HIGH SENSITIVITY): Troponin I (High Sensitivity): 4 ng/L (ref <18)

## 2022-10-12 MED ORDER — IPRATROPIUM-ALBUTEROL 0.5-2.5 (3) MG/3ML IN SOLN
3.0000 mL | Freq: Once | RESPIRATORY_TRACT | Status: AC
  Administered 2022-10-12: 3 mL via RESPIRATORY_TRACT
  Filled 2022-10-12: qty 3

## 2022-10-12 MED ORDER — IOHEXOL 350 MG/ML SOLN
100.0000 mL | Freq: Once | INTRAVENOUS | Status: AC | PRN
  Administered 2022-10-12: 100 mL via INTRAVENOUS

## 2022-10-12 NOTE — ED Triage Notes (Signed)
Pt was brought to the ER from [place] clinic, pt was admitted last week for low O2, pt states that she went to a follow up at her pcp and she was told her O2 was in the 80's and was sent to the ER, pt states that she didn't feel sob, pt was placed on 3 L

## 2022-10-12 NOTE — ED Notes (Signed)
First Nurse Note: Patient to ED via [facility] from [facility] for low O2. Patient was in the low 80's on RA at the clinic. Patient currently on 3L [place] and was given 1 albuterol breathing treatment. Was seen for the same approx 1 week ago. Patient denies any symptoms at this time.  78 HR 152/98 94% 3L 98.3 temp

## 2022-10-12 NOTE — ED Notes (Signed)
Pt verbalizes understanding of discharge instructions. Opportunity for questioning and answers were provided. Pt discharged from ED to home with family.    

## 2022-10-12 NOTE — Discharge Instructions (Signed)
Go to your lung doctor appointment later this month as scheduled.   See your doctor to talk about your thyroid nodule as found on your CT scan today.  Thank you for choosing [place] for your health care today!  Please see your primary doctor this week for a follow up appointment.   Sometimes, in the early stages of certain disease courses it is difficult to detect in the emergency department evaluation -- so, it is important that you continue to monitor your symptoms and call your doctor right away or return to the emergency department if you develop any new or worsening symptoms.  Please go to the following website to schedule new (and existing) patient appointments:   [email or web address]  If you do not have a primary doctor try calling the following clinics to establish care:  If you have insurance:  [facility] [phone] [place] [address]   [facility]  [phone] [address] [place] [address]   If you do not have insurance:  [facility]  [phone] [address] [place] [address]   The following is another list of primary care offices in the area who are accepting new patients at this time.  Please reach out to one of them directly and let them know you would like to schedule an appointment to follow up on an Emergency Department visit, and/or to establish a new primary care provider (PCP).  There are likely other primary care clinics in the are who are accepting new patients, but this is an excellent place to start:  [place] physician: Dr Lavon Paganini [address] [place], [place] [address] [phone]  [facility] Lead Physician: Dr Steele Sizer [address], [place], [place] [address] [phone]  [place] Physician: Dr Park Liter [address] [place], [place] [address] [phone]  [facility] Lead Physician: Dr Dewaine Oats [place], [place], [place] [address] [phone]  [place] Physician: Dr Halina Maidens [address] [place], [place] [address] [phone]   It was my pleasure to care for you today.   [place] Brunette Jacelyn Grip, MD

## 2022-10-12 NOTE — ED Provider Notes (Addendum)
[facility] Provider Note    Event Date/Time   First MD Initiated Contact with Patient 10/12/22 1805     (approximate)   History   Shortness of Breath   HPI  Peggy Villa is a 57 y.o. female   Past medical history of daily smoker no formal diagnosis of COPD, she does have asthma, pretension, hypothyroid, presents to the emergency department with low oxygen levels from outpatient clinic.  She was recently admitted for asthma exacerbation and was treated and discharged and this was her regular outpatient follow-up after admission.  Hypoxemic in the 80s.  However she denies any acute medical complaints including chest pain, shortness of breath, cough, weakness.    History was obtained via the patient.  Her son is at bedside to offer collateral information and states that mom has been doing very well since her discharge from the hospital. I reviewed external medical notes including hospital visit discharge summary dated 10/06/2022 when she presented with dyspnea and diagnosed with asthma exacerbation treated with steroids.     Physical Exam   Triage Vital Signs: ED Triage Vitals  Enc Vitals Group     BP 10/12/22 1643 (!) 146/89     Pulse Rate 10/12/22 1643 78     Resp 10/12/22 1643 18     Temp --      Temp src --      SpO2 10/12/22 1643 (!) 89 %     Weight 10/12/22 1646 (!) 311 lb (141.1 kg)     Height 10/12/22 1646 '5\' 7"'$  (1.702 m)     Head Circumference --      Peak Flow --      Pain Score 10/12/22 1646 0     Pain Loc --      Pain Edu? --      Excl. in [place]? --     Most recent vital signs: Vitals:   10/12/22 2111 10/12/22 2225  BP:    Pulse: 77 72  Resp: 17 17  SpO2: 93% 92%    General: Awake, no distress.  CV:  Good peripheral perfusion.  Resp:  Normal effort.  Abd:  No distention.  Other:  Awake alert oriented no respiratory distress, no hypoxemia on room air, speaking in full sentences oxygen level ranges from 93 to 99% while  talking to me on room air.  Lungs clear to auscultation without calories or wheezing.  Appears euvolemic.   ED Results / Procedures / Treatments   Labs (all labs ordered are listed, but only abnormal results are displayed) Labs Reviewed  COMPREHENSIVE METABOLIC PANEL - Abnormal; Notable for the following components:      Result Value   Potassium 3.4 (*)    Glucose, Bld 148 (*)    Calcium 8.8 (*)    Albumin 3.4 (*)    AST 11 (*)    All other components within normal limits  CBC WITH DIFFERENTIAL/PLATELET - Abnormal; Notable for the following components:   WBC 11.8 (*)    RBC 5.18 (*)    MCH 25.9 (*)    MCHC 29.4 (*)    Neutro Abs 9.2 (*)    All other components within normal limits  BLOOD GAS, VENOUS - Abnormal; Notable for the following components:   pCO2, Ven 79 (*)    pO2, Ven 56 (*)    Bicarbonate 40.7 (*)    Acid-Base Excess 11.1 (*)    All other components within normal limits  TROPONIN I (  HIGH SENSITIVITY)  TROPONIN I (HIGH SENSITIVITY)     I reviewed labs and they are notable for mild white blood cell count elevation 11.8.  pCO2 appears to be chronically elevated at 79 with normal pH.  EKG  ED ECG REPORT I, Lucillie Garfinkel, the attending physician, personally viewed and interpreted this ECG.   Date: 10/12/2022  EKG Time: 1704  Rate: 84  Rhythm: Normal sinus rhythm  Axis: nl  Intervals:none  ST&T Change: No acute ischemic changes    RADIOLOGY I independently reviewed and interpreted chest x-ray and see no obvious focalities or pneumothorax   PROCEDURES:  Critical Care performed: No  Procedures   MEDICATIONS ORDERED IN ED: Medications  ipratropium-albuterol (DUONEB) 0.5-2.5 (3) MG/3ML nebulizer solution 3 mL (3 mLs Nebulization Given 10/12/22 1902)  iohexol (OMNIPAQUE) 350 MG/ML injection 100 mL (100 mLs Intravenous Contrast Given 10/12/22 2139)     IMPRESSION / MDM / ASSESSMENT AND PLAN / ED COURSE  I reviewed the triage vital signs and the nursing  notes.                              Differential diagnosis includes, but is not limited to, COPD exacerbation, asthma exacerbation, PE, bacterial pneumonia, viral URI, ACS   MDM: This is a patient with asymptomatic hypoxemia but on room air does not desaturate into the 80s.  Perhaps he has undiagnosed COPD and is chronically low, no wheezing on auscultation, no focality, no complaints and clear chest x-ray.  Will check troponins EKG which is nonischemic, CT angiogram to rule out pulmonary embolism.  If all is negative and oxygen saturations above 90%, can discharge home in this condition and follow-up with pulmonology as she has scheduled for next week.   CT angiogram shows no blood clot.  Incidental findings explained to the patient.  She will follow-up about thyroid nodule. Oxygen saturation has been 90 to 99% on room air in the emergency department completely asymptomatic no chest pain to suggest ACS nonischemic EKG and initial troponin is 4.  Defer second troponin given very low clinical suspicion for ACS.  Discussed patient she is in agreement.  Plan for discharge pulmonology follow-up as scheduled.  Patient's presentation is most consistent with acute presentation with potential threat to life or bodily function.       FINAL CLINICAL IMPRESSION(S) / ED DIAGNOSES   Final diagnoses:  Hypoxia     Rx / DC Orders   ED Discharge Orders     None        Note:  This document was prepared using Dragon voice recognition software and may include unintentional dictation errors.    Lucillie Garfinkel, MD 10/12/22 [place]    Lucillie Garfinkel, MD 10/12/22 2226

## 2022-10-12 NOTE — ED Provider Triage Note (Signed)
Emergency Medicine Provider Triage Evaluation Note  Peggy Villa , a 57 y.o. female  was evaluated in triage.  Pt complains of hypoxia. Recent admission for COPD/Asthma exacerbation. PAtient has been on steroids since DC but today in PCP her O2 was in the 80's. On 3 LPM in room. When O2 is stopped she falls to 88%..  Review of Systems  Positive: Shob, hypoxia Negative: CP, cough, fever  Physical Exam  LMP 02/01/2016  Gen:   Awake, no distress   Resp:  Normal effort  MSK:   Moves extremities without difficulty  Other:    Medical Decision Making  Medically screening exam initiated at 4:40 PM.  Appropriate orders placed.  Peggy Villa was informed that the remainder of the evaluation will be completed by another provider, this initial triage assessment does not replace that evaluation, and the importance of remaining in the ED until their evaluation is complete.  Patient from PCP for hospital follow-up. Hypoxic. New oxygen demand. Labs, EKG, xray at this time   Peggy Villa 10/12/22 1648

## 2022-10-12 NOTE — ED Notes (Signed)
IV team at bedside 

## 2022-10-12 NOTE — ED Notes (Signed)
Pt difficult IV stick causing delay in trop and CT scan.

## 2022-10-24 ENCOUNTER — Ambulatory Visit: Payer: 59 | Admitting: Student in an Organized Health Care Education/Training Program

## 2022-10-24 ENCOUNTER — Encounter: Payer: Self-pay | Admitting: Student in an Organized Health Care Education/Training Program

## 2022-10-24 VITALS — BP 134/74 | HR 87 | Temp 97.9°F | Ht 67.0 in | Wt 317.6 lb

## 2022-10-24 DIAGNOSIS — R0602 Shortness of breath: Secondary | ICD-10-CM

## 2022-10-24 DIAGNOSIS — J986 Disorders of diaphragm: Secondary | ICD-10-CM

## 2022-10-24 DIAGNOSIS — Z9189 Other specified personal risk factors, not elsewhere classified: Secondary | ICD-10-CM

## 2022-10-24 DIAGNOSIS — J9612 Chronic respiratory failure with hypercapnia: Secondary | ICD-10-CM

## 2022-10-24 MED ORDER — BUDESONIDE-FORMOTEROL FUMARATE 80-4.5 MCG/ACT IN AERO: 2.0000 | INHALATION_SPRAY | Freq: Two times a day (BID) | RESPIRATORY_TRACT | 12 refills | Status: DC

## 2022-10-24 NOTE — Progress Notes (Signed)
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 CT scan there again demonstrated the chronically elevated left hemi-diaphragm. VBG obtained during her stay in the ED showed hypercapnia that appears to be compensated. Patient also reports a having been told she has asthma.  My differential for her symptoms include shortness of breath secondary to neuro-muscular and diaphragmatic paralysis. This could have lead to chronic and compensated hypercapnia, and her reserve is limited. Chronic hypercapnia could contribute to same hypoxia based on the alveolar gas equation. I do believe she would benefit from BiPAP for the hypercapnia, but given she reports day time somnolence to me, she would benefit from a split night sleep study for BiPAP titration. I will also order a PFT to assess her spirometry, lung volumes, and DLCO. Finally, I will start her on Symbicort as a controller medicine for asthma while I work up the cause behind her hypoxia and shortness of breath.  - Split night study; Future - Pulmonary Function Test [facility] Only; Future - ECHOCARDIOGRAM COMPLETE; Future - budesonide-formoterol (SYMBICORT) 80-4.5 MCG/ACT inhaler; Inhale 2 puffs into the lungs in the morning and at bedtime.  Dispense: 1 each; Refill: 12   Return in about 3 months (around 01/23/2023).  I spent 60 minutes caring for this patient today, including preparing to see the patient, obtaining a medical history , reviewing a separately obtained history, performing a medically appropriate examination and/or evaluation, counseling and educating the patient/family/caregiver, ordering medications, tests, or procedures, documenting clinical information in the  electronic health record, and independently interpreting results (not separately reported/billed) and communicating results to the patient/family/caregiver  Armando Reichert, MD [facility] 10/24/2022 1:50 PM    End of visit medications:  Meds ordered this encounter  Medications   budesonide-formoterol (SYMBICORT) 80-4.5 MCG/ACT inhaler    Sig: Inhale 2 puffs into the lungs in the morning and at bedtime.    Dispense:  1 each    Refill:  12     Current Outpatient Medications:    albuterol (PROVENTIL HFA;VENTOLIN HFA) 108 (90 Base) MCG/ACT inhaler, Inhale 2-4 puffs by mouth every 4 hours as needed for wheezing, cough, and/or shortness of breath, Disp: 1 Inhaler, Rfl: 1   amLODipine (NORVASC) 10 MG tablet, Take 10 mg by mouth daily., Disp: , Rfl:    budesonide-formoterol (SYMBICORT) 80-4.5 MCG/ACT inhaler, Inhale 2 puffs into the lungs in the morning and at bedtime., Disp: 1 each, Rfl: 12   escitalopram (LEXAPRO) 10 MG tablet, Take 10 mg by mouth daily., Disp: , Rfl:    hydrocortisone 1 % ointment, Apply 1 Application topically 2 (two) times daily., Disp: 30 g, Rfl: 0   Miconazole Nitrate 2 % OINT, Apply 1 Application topically 2 (two) times daily. Apply until rash is gone and for 1 week thereafter, Disp: 56 g, Rfl: 0   pantoprazole (PROTONIX) 40 MG tablet, Take 40 mg by mouth daily., Disp: , Rfl:    solifenacin (VESICARE) 10 MG tablet, Take 10 mg by mouth daily., Disp: , Rfl:    Subjective:   PATIENT ID: Peggy Villa GENDER: female DOB: 10-23-1965, MRN: [id number]  Chief Complaint  Patient presents with   pulmonary consult    Hx of asthma.SOB with exertion and occ wheezing.  HPI  The patient is a 57 year old female presenting to clinic for the evaluation of shortness of breath.  She has a history of asthma that was diagnosed 2 or 3 years ago for which she was briefly placed on Symbicort as well as on albuterol.  Around December, she started noticing  worsening exertional dyspnea when walking from her car to work.  This was associated with a cough that is nonproductive that has since resolved.  Dyspnea, however, has continued.  While with a friend, she was short of breath and her oxygen level was measured and the SpO2 was down to 85% prompting her to be evaluated by EMS and transferred to the hospital.  In the ED, her oxygen sats were in the 90s but she did undergo a CT scan of the chest with PE protocol.  This was negative for pulmonary embolism but notable for severe elevation of the left hemidiaphragm with underlying atelectasis/collapse.  She was discharged home but her symptoms persist.  Today, she has no symptoms besides the shortness of breath.  She has no cough, no sputum production, no fevers, no chills, no chest pain, and no chest tightness.  She reports daytime somnolence and wakes up tired.  She reports weight gain and has been slowly gaining weight over the past few years.  She currently is the heaviest she has been.  She is only using her albuterol which offers her some relief.  Review of her medical record is notable for known elevated left hemidiaphragm. She's had CT scans in the past showing said elevation as well as a sniff test again showing it. She denies any surgeries or trauma's to her neck, chest, or abdomen.  She is a former smoker, having quit in 2019 and with 10-pack-year smoking history.  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  Ancillary information including prior medications, full medical/surgical/family/social histories, and PFTs (when available) are listed below and have been reviewed.   Review of Systems  Constitutional:  Negative for chills, fever and weight loss.  Respiratory:  Positive for shortness of breath. Negative for cough, sputum production and wheezing.   Cardiovascular:  Negative for chest pain and palpitations.     Objective:   Vitals:   10/24/22 1328  BP: 134/74  Pulse:  87  Temp: 97.9 F (36.6 C)  TempSrc: Temporal  SpO2: 94%  Weight: (!) 317 lb 9.6 oz (144.1 kg)  Height: '5\' 7"'$  (1.702 m)   94% on RA  BMI Readings from Last 3 Encounters:  10/24/22 49.74 kg/m  10/12/22 48.71 kg/m  10/04/22 47.30 kg/m   Wt Readings from Last 3 Encounters:  10/24/22 (!) 317 lb 9.6 oz (144.1 kg)  10/12/22 (!) 311 lb (141.1 kg)  10/04/22 (!) 302 lb 0.5 oz (137 kg)    Physical Exam Constitutional:      General: She is not in acute distress.    Appearance: She is obese. She is not ill-appearing.  HENT:     Head: Normocephalic.     Nose: Nose normal.     Mouth/Throat:     Mouth: Mucous membranes are moist.  Cardiovascular:     Rate and Rhythm: Normal rate and regular rhythm.     Pulses: Normal pulses.     Heart sounds: Normal heart sounds.  Pulmonary:     Effort: Pulmonary effort is normal.     Breath sounds: Normal breath sounds.     Comments: Decreased breath sounds over the left lower lung  fields Abdominal:     Palpations: Abdomen is soft.  Neurological:     General: No focal deficit present.     Mental Status: She is alert and oriented to person, place, and time. Mental status is at baseline.     Ancillary Information    Past Medical History:  Diagnosis Date   Asthma    Essential hypertension 09/11/2014   Hypertension    Hypothyroidism 09/11/2014   Obesity    Shoulder pain 12/02/2016     Family History  Problem Relation Age of Onset   Lupus Mother    Cancer Father    Breast cancer Neg Hx    Prostate cancer Neg Hx    Bladder Cancer Neg Hx    Kidney cancer Neg Hx      Past Surgical History:  Procedure Laterality Date   BREAST REDUCTION SURGERY     REDUCTION MAMMAPLASTY Bilateral 2006   TUBAL LIGATION      Social History   Socioeconomic History   Marital status: Widowed    Spouse name: Not on file   Number of children: Not on file   Years of education: Not on file   Highest education level: Not on file  Occupational  History   Not on file  Tobacco Use   Smoking status: Former    Packs/day: 0.25    Years: 10.00    Total pack years: 2.50    Types: Cigarettes    Quit date: 2019    Years since quitting: 5.0   Smokeless tobacco: Never  Vaping Use   Vaping Use: Never used  Substance and Sexual Activity   Alcohol use: Yes    Comment: socially   Drug use: Not Currently   Sexual activity: Not on file  Other Topics Concern   Not on file  Social History Narrative   Not on file   Social Determinants of Health   Financial Resource Strain: Not on file  Food Insecurity: No Food Insecurity (10/05/2022)   Hunger Vital Sign    Worried About Running Out of Food in the Last Year: Never true    Ran Out of Food in the Last Year: Never true  Transportation Needs: No Transportation Needs (10/05/2022)   PRAPARE - Hydrologist (Medical): No    Lack of Transportation (Non-Medical): No  Physical Activity: Not on file  Stress: Not on file  Social Connections: Not on file  Intimate Partner Violence: Not At Risk (10/05/2022)   Humiliation, Afraid, Rape, and Kick questionnaire    Fear of Current or Ex-Partner: No    Emotionally Abused: No    Physically Abused: No    Sexually Abused: No     Allergies  Allergen Reactions   Milk Protein Anaphylaxis   Penicillin G Swelling   Milk-Related Compounds    Other    Peanut-Containing Drug Products    Penicillins      CBC    Component Value Date/Time   WBC 11.8 (H) 10/12/2022 1656   RBC 5.18 (H) 10/12/2022 1656   HGB 13.4 10/12/2022 1656   HGB 11.1 (L) 07/18/2013 2128   HCT 45.6 10/12/2022 1656   HCT 34.4 (L) 07/18/2013 2128   PLT 237 10/12/2022 1656   PLT 227 07/18/2013 2128   MCV 88.0 10/12/2022 1656   MCV 76 (L) 07/18/2013 2128   MCH 25.9 (L) 10/12/2022 1656   MCHC 29.4 (L) 10/12/2022 1656   RDW 13.1 10/12/2022 1656   RDW 15.2 (  H) 07/18/2013 2128   LYMPHSABS 1.8 10/12/2022 1656   MONOABS 0.7 10/12/2022 1656   EOSABS 0.1  10/12/2022 1656   BASOSABS 0.0 10/12/2022 1656    Pulmonary Functions Testing Results:     No data to display          Outpatient Medications Prior to Visit  Medication Sig Dispense Refill   albuterol (PROVENTIL HFA;VENTOLIN HFA) 108 (90 Base) MCG/ACT inhaler Inhale 2-4 puffs by mouth every 4 hours as needed for wheezing, cough, and/or shortness of breath 1 Inhaler 1   amLODipine (NORVASC) 10 MG tablet Take 10 mg by mouth daily.     escitalopram (LEXAPRO) 10 MG tablet Take 10 mg by mouth daily.     hydrocortisone 1 % ointment Apply 1 Application topically 2 (two) times daily. 30 g 0   Miconazole Nitrate 2 % OINT Apply 1 Application topically 2 (two) times daily. Apply until rash is gone and for 1 week thereafter 56 g 0   pantoprazole (PROTONIX) 40 MG tablet Take 40 mg by mouth daily.     solifenacin (VESICARE) 10 MG tablet Take 10 mg by mouth daily.     SYMBICORT 160-4.5 MCG/ACT inhaler Inhale 2 puffs into the lungs 2 (two) times daily.     No facility-administered medications prior to visit.

## 2022-10-31 ENCOUNTER — Other Ambulatory Visit (HOSPITAL_COMMUNITY): Payer: Self-pay

## 2022-11-01 ENCOUNTER — Telehealth: Payer: Self-pay | Admitting: Student in an Organized Health Care Education/Training Program

## 2022-11-01 DIAGNOSIS — Z9189 Other specified personal risk factors, not elsewhere classified: Secondary | ICD-10-CM

## 2022-11-01 DIAGNOSIS — R0602 Shortness of breath: Secondary | ICD-10-CM

## 2022-11-03 ENCOUNTER — Other Ambulatory Visit (HOSPITAL_COMMUNITY): Payer: Self-pay

## 2022-11-15 ENCOUNTER — Ambulatory Visit: Payer: BC Managed Care – PPO | Attending: Otolaryngology

## 2022-11-15 DIAGNOSIS — G4733 Obstructive sleep apnea (adult) (pediatric): Secondary | ICD-10-CM | POA: Insufficient documentation

## 2022-11-15 DIAGNOSIS — E119 Type 2 diabetes mellitus without complications: Secondary | ICD-10-CM | POA: Diagnosis not present

## 2022-11-15 DIAGNOSIS — I10 Essential (primary) hypertension: Secondary | ICD-10-CM | POA: Insufficient documentation

## 2022-11-15 DIAGNOSIS — J45909 Unspecified asthma, uncomplicated: Secondary | ICD-10-CM | POA: Diagnosis not present

## 2022-11-15 DIAGNOSIS — E669 Obesity, unspecified: Secondary | ICD-10-CM | POA: Diagnosis present

## 2022-11-28 ENCOUNTER — Telehealth (INDEPENDENT_AMBULATORY_CARE_PROVIDER_SITE_OTHER): Payer: BC Managed Care – PPO | Admitting: Pulmonary Disease

## 2022-11-28 DIAGNOSIS — J9612 Chronic respiratory failure with hypercapnia: Secondary | ICD-10-CM | POA: Diagnosis not present

## 2022-11-28 DIAGNOSIS — G4733 Obstructive sleep apnea (adult) (pediatric): Secondary | ICD-10-CM | POA: Diagnosis not present

## 2022-11-28 DIAGNOSIS — J9611 Chronic respiratory failure with hypoxia: Secondary | ICD-10-CM | POA: Diagnosis not present

## 2022-11-28 NOTE — Telephone Encounter (Signed)
Split-night study showed severe OSA and central events -These were corrected by BiPAP, titrated to 24/20 with 2 L oxygen    -Suggest start auto bilevel , EPAP minimum 10, pressure support +4, IPAP maximum 24 with 2 L oxygen blended in , medium fullface mask

## 2022-11-29 ENCOUNTER — Ambulatory Visit (HOSPITAL_BASED_OUTPATIENT_CLINIC_OR_DEPARTMENT_OTHER): Payer: BC Managed Care – PPO

## 2022-11-29 ENCOUNTER — Ambulatory Visit
Admission: RE | Admit: 2022-11-29 | Discharge: 2022-11-29 | Disposition: A | Discharge status: Home / Self Care | Payer: BC Managed Care – PPO | Source: Ambulatory Visit | Attending: Student in an Organized Health Care Education/Training Program | Admitting: Student in an Organized Health Care Education/Training Program

## 2022-11-29 ENCOUNTER — Other Ambulatory Visit: Payer: Self-pay | Admitting: Student in an Organized Health Care Education/Training Program

## 2022-11-29 DIAGNOSIS — R0602 Shortness of breath: Secondary | ICD-10-CM | POA: Diagnosis present

## 2022-11-29 DIAGNOSIS — G4733 Obstructive sleep apnea (adult) (pediatric): Secondary | ICD-10-CM

## 2022-11-29 DIAGNOSIS — Z9189 Other specified personal risk factors, not elsewhere classified: Secondary | ICD-10-CM | POA: Diagnosis not present

## 2022-11-29 DIAGNOSIS — I517 Cardiomegaly: Secondary | ICD-10-CM | POA: Diagnosis not present

## 2022-11-29 DIAGNOSIS — E119 Type 2 diabetes mellitus without complications: Secondary | ICD-10-CM | POA: Diagnosis not present

## 2022-11-29 DIAGNOSIS — F17210 Nicotine dependence, cigarettes, uncomplicated: Secondary | ICD-10-CM | POA: Diagnosis not present

## 2022-11-29 DIAGNOSIS — R0600 Dyspnea, unspecified: Secondary | ICD-10-CM | POA: Insufficient documentation

## 2022-11-29 LAB — PULMONARY FUNCTION TEST ARMC ONLY
DL/VA % pred: 132 %
DL/VA: 5.53 ml/min/mmHg/L
DLCO unc % pred: 52 %
DLCO unc: 12.02 ml/min/mmHg
FEF 25-75 Post: 1.94 L/sec
FEF 25-75 Pre: 0.88 L/sec
FEF2575-%Change-Post: 119 %
FEF2575-%Pred-Post: 70 %
FEF2575-%Pred-Pre: 32 %
FEV1-%Change-Post: 26 %
FEV1-%Pred-Post: 32 %
FEV1-%Pred-Pre: 25 %
FEV1-Post: 0.96 L
FEV1-Pre: 0.76 L
FEV1FVC-%Change-Post: 2 %
FEV1FVC-%Pred-Pre: 110 %
FEV6-%Change-Post: 23 %
FEV6-%Pred-Post: 28 %
FEV6-%Pred-Pre: 23 %
FEV6-Post: 1.06 L
FEV6-Pre: 0.86 L
FEV6FVC-%Pred-Post: 103 %
FEV6FVC-%Pred-Pre: 103 %
FVC-%Change-Post: 23 %
FVC-%Pred-Post: 27 %
FVC-%Pred-Pre: 22 %
FVC-Post: 1.06 L
FVC-Pre: 0.86 L
Post FEV1/FVC ratio: 90 %
Post FEV6/FVC ratio: 100 %
Pre FEV1/FVC ratio: 88 %
Pre FEV6/FVC Ratio: 100 %
RV % pred: 74 %
RV: 1.52 L
TLC % pred: 42 %
TLC: 2.35 L

## 2022-11-29 LAB — ECHOCARDIOGRAM COMPLETE
AR max vel: 2.77 cm2
AV Area VTI: 2.84 cm2
AV Area mean vel: 2.13 cm2
AV Mean grad: 6 mmHg
AV Peak grad: 9.5 mmHg
Ao pk vel: 1.54 m/s
Area-P 1/2: 4.21 cm2
MV VTI: 2.51 cm2
S' Lateral: 3.3 cm

## 2022-11-29 MED ORDER — ALBUTEROL SULFATE (2.5 MG/3ML) 0.083% IN NEBU
2.5000 mg | INHALATION_SOLUTION | Freq: Once | RESPIRATORY_TRACT | Status: AC
  Administered 2022-11-29: 2.5 mg via RESPIRATORY_TRACT
  Filled 2022-11-29: qty 3

## 2022-11-29 MED ORDER — BUDESONIDE-FORMOTEROL FUMARATE 80-4.5 MCG/ACT IN AERO: 2.0000 | INHALATION_SPRAY | Freq: Two times a day (BID) | RESPIRATORY_TRACT | 12 refills | Status: DC

## 2022-11-29 NOTE — Progress Notes (Signed)
Left message x1 for patient.

## 2022-11-29 NOTE — Telephone Encounter (Signed)
Armando Reichert, MD  You; Gilford Rile, Emilia Beck, Kristina H, CMA1 hour ago (2:23 PM)    Placed orders for auto-bi level. thanks    Patient is aware of results and voiced her understanding. She agrees with treatment plan.  [facility] request.  Nothing further needed.

## 2022-11-29 NOTE — Telephone Encounter (Signed)
I received a copy of the sleep study and gave it to Dr. Genia Harold to review

## 2022-11-29 NOTE — Progress Notes (Signed)
I sent to the order to [facility]

## 2022-11-29 NOTE — Telephone Encounter (Signed)
Pt is looking for results from todays testing

## 2022-11-29 NOTE — Telephone Encounter (Signed)
The actual sleep study has not been scanned into Epic yet. I am calling [facility] now to get a copy of the sleep stuyd

## 2022-11-30 ENCOUNTER — Telehealth: Payer: Self-pay | Admitting: Student in an Organized Health Care Education/Training Program

## 2022-11-30 NOTE — Telephone Encounter (Signed)
That's fine, thank you for letting me know

## 2022-11-30 NOTE — Telephone Encounter (Signed)
Noted.   Routing to Dr. Genia Harold as an Peggy Villa

## 2022-11-30 NOTE — Telephone Encounter (Signed)
Will close encounter

## 2022-11-30 NOTE — Telephone Encounter (Signed)
[facility] called. His name is Peggy Villa @ [phone]  They said this PT Primary [facility] Dr changed her inhaler to Generic Advair 250/50 due to cost (Only 12.00) and they wanted [place] to know because we sent in a differing RX. Nothing further needed. Just noting chart and letting Triage/Dr.  know so once seen you can sign. Thanks.

## 2022-12-09 IMAGING — CR DG CHEST 2V
1 series · 2 of 2 positions shown · non-contrast
Comparison: 06/24/2020

CLINICAL DATA: Shortness of breath

EXAM:
CHEST - 2 VIEW

[Series 1: dg chest 2 view · 0.14mm/px · 2 of 2 slices shown]
[im 1/2]
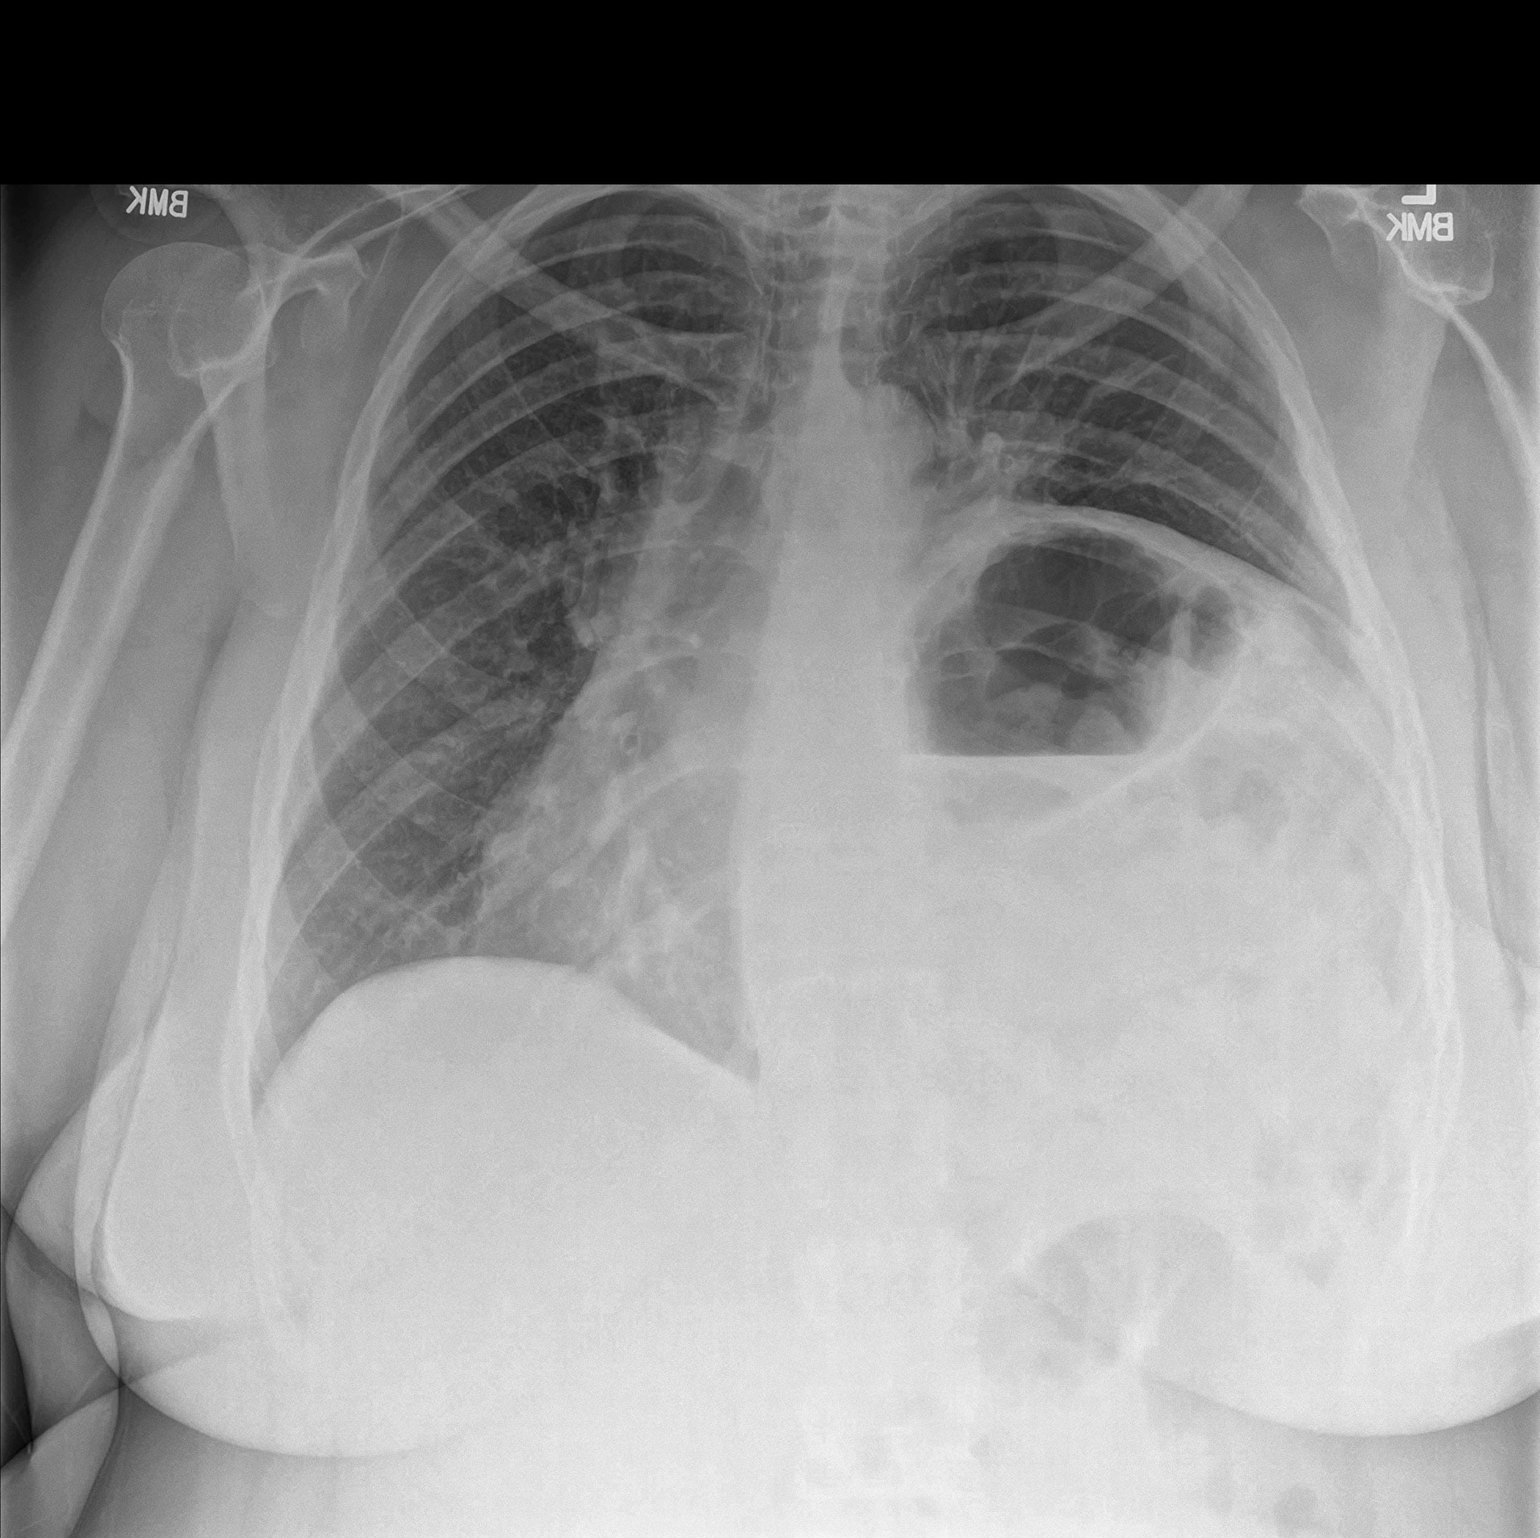
[im 2/2]
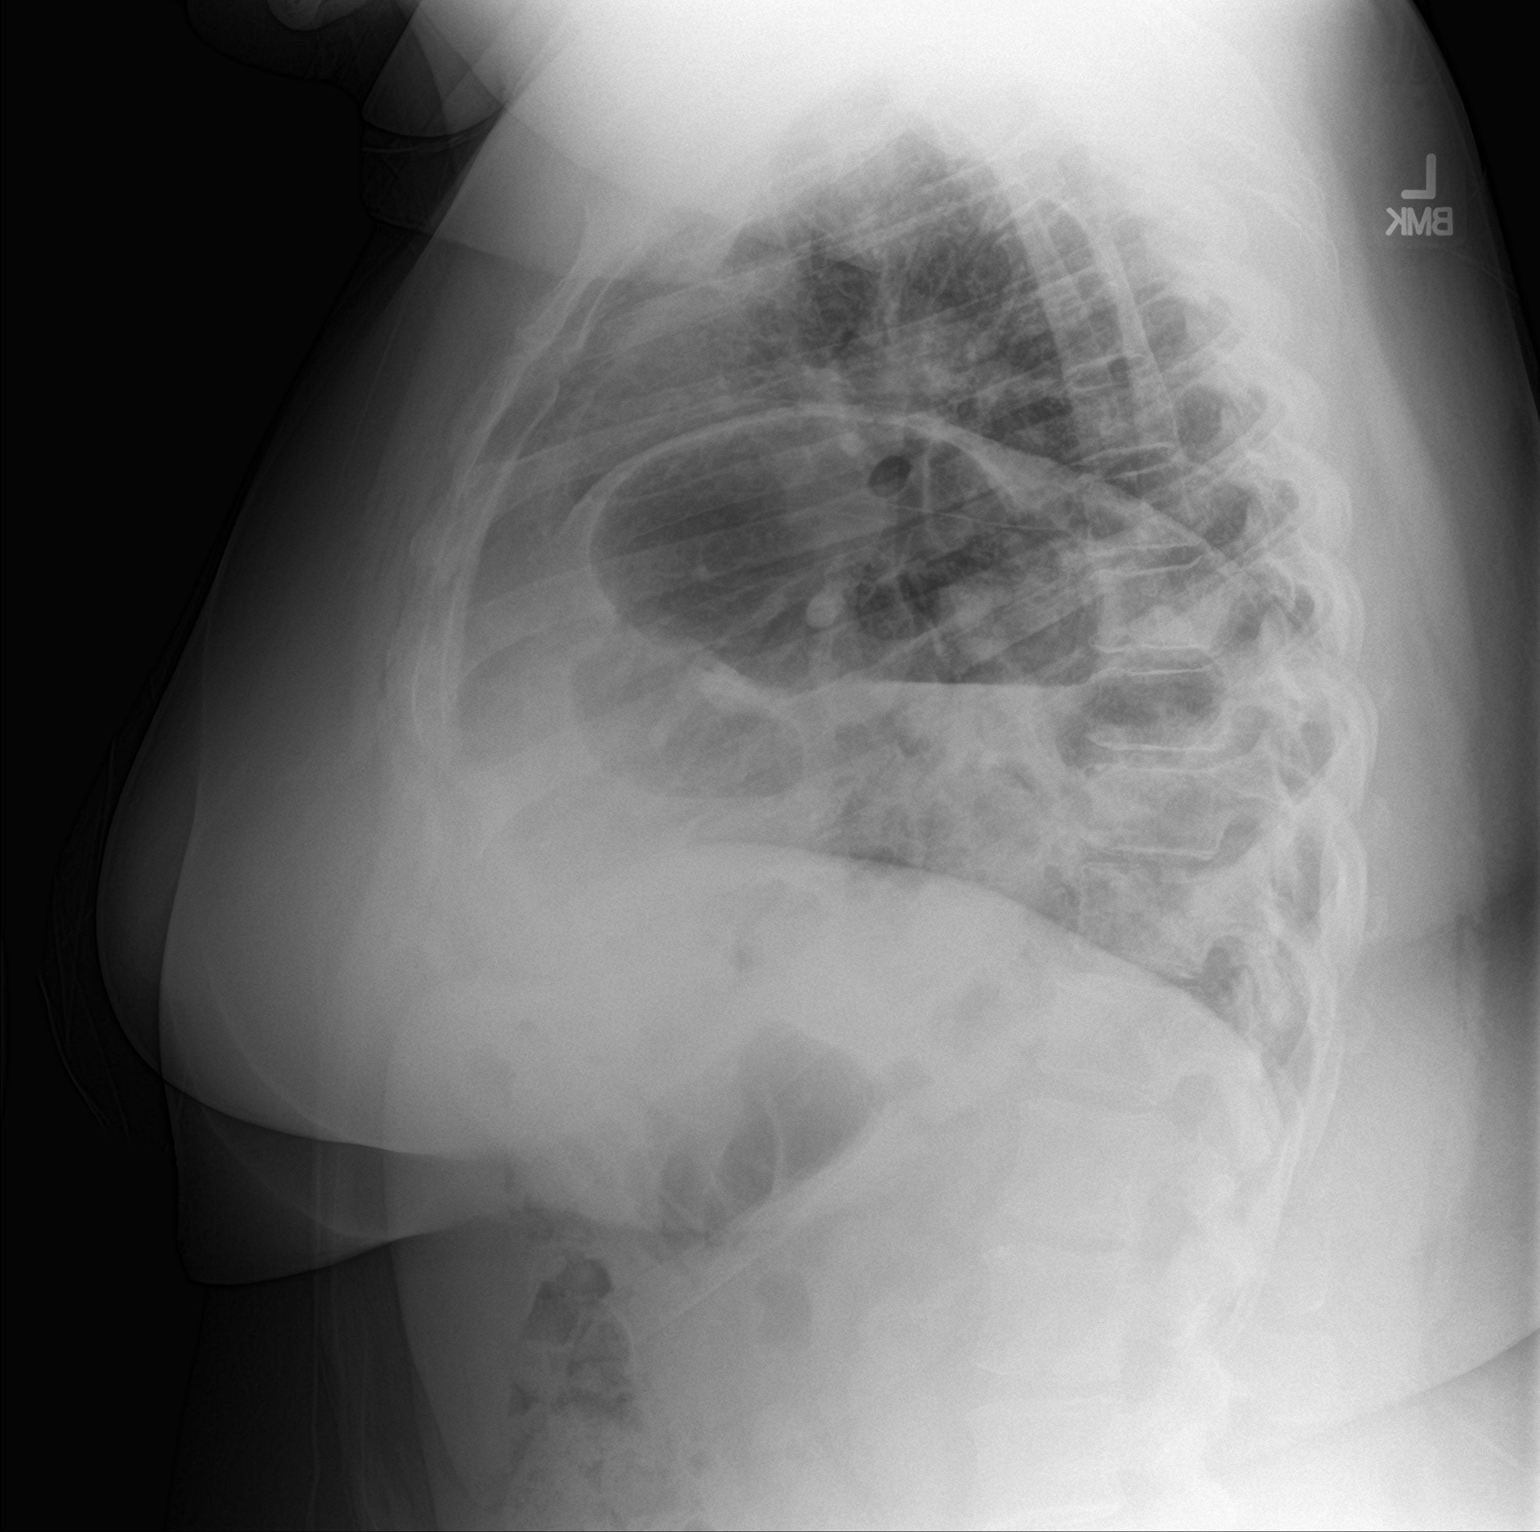

[2 of 2 positions shown; findings below may reference images not displayed]

FINDINGS: Chronic elevation left diaphragm with mild atelectasis. Right lung
is clear. Stable cardiomediastinal silhouette. No pneumothorax.
IMPRESSION: No active cardiopulmonary disease.  Chronic elevation left diaphragm

## 2023-02-20 ENCOUNTER — Encounter: Payer: Self-pay | Admitting: Student in an Organized Health Care Education/Training Program

## 2023-02-20 ENCOUNTER — Ambulatory Visit: Payer: BC Managed Care – PPO | Admitting: Student in an Organized Health Care Education/Training Program

## 2023-02-20 VITALS — BP 140/80 | HR 88 | Temp 98.3°F | Ht 67.0 in | Wt 319.2 lb

## 2023-02-20 DIAGNOSIS — G4733 Obstructive sleep apnea (adult) (pediatric): Secondary | ICD-10-CM

## 2023-02-20 DIAGNOSIS — R0602 Shortness of breath: Secondary | ICD-10-CM

## 2023-02-20 DIAGNOSIS — J986 Disorders of diaphragm: Secondary | ICD-10-CM

## 2023-02-20 DIAGNOSIS — J9612 Chronic respiratory failure with hypercapnia: Secondary | ICD-10-CM

## 2023-02-20 DIAGNOSIS — J9611 Chronic respiratory failure with hypoxia: Secondary | ICD-10-CM

## 2023-02-20 NOTE — Progress Notes (Signed)
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 CT scan there again demonstrated the chronically elevated left hemi-diaphragm. VBG obtained during her stay in the ED showed compensated hypercapnia. Patient also reports a having been told she has asthma.  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 She is on diuretics and has to get up multiple times a night to pee. I did stress the importance of compliance with NIPPV to her.   Her shortness of breath is mostly driven by severe restriction given findings on the PFT. 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 Chronic hypercapnia could contribute to hypoxia based on the alveolar gas equation.  - Ambulatory referral to Neurology - continue NIPPV - continue advair - continued weight loss encouraged   Return in about 6 months (around 08/23/2023).  I spent 20 minutes caring for this patient today, including preparing to see the patient, obtaining a medical history , reviewing a separately obtained history, performing a medically appropriate examination and/or evaluation, counseling and educating the patient/family/caregiver, ordering medications, tests, or procedures, documenting clinical information in the electronic health record, and independently interpreting results (not separately reported/billed) and communicating results to the patient/family/caregiver  Raechel Chute, MD [facility] 02/20/2023 5:06 PM    End of visit medications:  No orders of the defined types were placed in this encounter.    Current Outpatient Medications:    albuterol (PROVENTIL HFA;VENTOLIN HFA)  108 (90 Base) MCG/ACT inhaler, Inhale 2-4 puffs by mouth every 4 hours as needed for wheezing, cough, and/or shortness of breath, Disp: 1 Inhaler, Rfl: 1   amLODipine (NORVASC) 10 MG tablet, Take 10 mg by mouth daily., Disp: , Rfl:    fluticasone-salmeterol (ADVAIR) 250-50 MCG/ACT AEPB, Inhale 1 puff into the lungs 2 (two) times daily., Disp: , Rfl:    furosemide (LASIX) 40 MG tablet, Take 40 mg by mouth daily., Disp: , Rfl:    solifenacin (VESICARE) 10 MG tablet, Take 10 mg by mouth daily., Disp: , Rfl:    escitalopram (LEXAPRO) 10 MG tablet, Take 10 mg by mouth daily. (Patient not taking: Reported on 02/20/2023), Disp: , Rfl:    pantoprazole (PROTONIX) 40 MG tablet, Take 40 mg by mouth daily. (Patient not taking: Reported on 02/20/2023), Disp: , Rfl:    Subjective:   PATIENT ID: Peggy Villa GENDER: female DOB: Mar 23, 1966, MRN: [id number]  Chief Complaint  Patient presents with   Follow-up    PFT-SOB with exertion.     HPI  The patient is a 57 year old female presenting to clinic for follow up.  Interval follow up reveals that the patient has had her sleep study and we placed an order for NIPPV which she has received and is compliant with. Sleep study had shown both OSA and central sleep apnea, and she's felt tremendously better with NIPPV. She is using it >4 hours around 60% of the time, compliance was stressed again during today's visit. Overall, shortness of breath is much improved. Dyspnea is overall improved. She has been working on weight loss.   She has a history of asthma that was diagnosed 2 or 3 years ago for which she was briefly placed on Symbicort as  well as on albuterol. This was switched to advair after our recent visit given insurance coverage.  Prior to her initial visit, she was short of breath and was noted to be hypoxic to 85%, prompting her to be evaluated by EMS and transferred to the hospital.  In the ED, her oxygen sats were in the 90s but she did undergo a CT scan  of the chest with PE protocol.  This was negative for pulmonary embolism but notable for severe elevation of the left hemidiaphragm with underlying atelectasis/collapse. She was discharged home but her symptoms persist.   Review of her medical record is notable for known elevated left hemidiaphragm. She's had CT scans in the past showing said elevation as well as a sniff test again showing it. She denies any surgeries or trauma's to her neck, chest, or abdomen.   She is a former smoker, having quit in 2019 and with 10-pack-year smoking history.  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  Ancillary information including prior medications, full medical/surgical/family/social histories, and PFTs (when available) are listed below and have been reviewed.   Review of Systems  Constitutional:  Negative for chills, fever and weight loss.  Respiratory:  Positive for shortness of breath. Negative for cough, sputum production and wheezing.   Cardiovascular:  Negative for chest pain and palpitations.     Objective:   Vitals:   02/20/23 1556  BP: (!) 140/80  Pulse: 88  Temp: 98.3 F (36.8 C)  TempSrc: Temporal  SpO2: 94%  Weight: (!) 319 lb 3.2 oz (144.8 kg)  Height: 5\' 7"  (1.702 m)   94% on RA BMI Readings from Last 3 Encounters:  02/20/23 49.99 kg/m  10/24/22 49.74 kg/m  10/12/22 48.71 kg/m   Wt Readings from Last 3 Encounters:  02/20/23 (!) 319 lb 3.2 oz (144.8 kg)  10/24/22 (!) 317 lb 9.6 oz (144.1 kg)  10/12/22 (!) 311 lb (141.1 kg)    Physical Exam Constitutional:      General: She is not in acute distress.    Appearance: She is obese. She is not ill-appearing.  HENT:     Head: Normocephalic.     Nose: Nose normal.     Mouth/Throat:     Mouth: Mucous membranes are moist.  Cardiovascular:     Rate and Rhythm: Normal rate and regular rhythm.     Pulses: Normal pulses.     Heart sounds: Normal heart sounds.  Pulmonary:     Effort: Pulmonary  effort is normal.     Breath sounds: Normal breath sounds.     Comments: Decreased breath sounds over the left lower lung fields Abdominal:     Palpations: Abdomen is soft.  Neurological:     General: No focal deficit present.     Mental Status: She is alert and oriented to person, place, and time. Mental status is at baseline.       Ancillary Information    Past Medical History:  Diagnosis Date   Asthma    Essential hypertension 09/11/2014   Hypertension    Hypothyroidism 09/11/2014   Obesity    Shoulder pain 12/02/2016     Family History  Problem Relation Age of Onset   Lupus Mother    Cancer Father    Breast cancer Neg Hx    Prostate cancer Neg Hx    Bladder Cancer Neg Hx    Kidney cancer Neg Hx      Past Surgical History:  Procedure  Laterality Date   BREAST REDUCTION SURGERY     REDUCTION MAMMAPLASTY Bilateral 2006   TUBAL LIGATION      Social History   Socioeconomic History   Marital status: Widowed    Spouse name: Not on file   Number of children: Not on file   Years of education: Not on file   Highest education level: Not on file  Occupational History   Not on file  Tobacco Use   Smoking status: Former    Packs/day: 0.25    Years: 10.00    Additional pack years: 0.00    Total pack years: 2.50    Types: Cigarettes    Quit date: 2019    Years since quitting: 5.3   Smokeless tobacco: Never  Vaping Use   Vaping Use: Never used  Substance and Sexual Activity   Alcohol use: Yes    Comment: socially   Drug use: Not Currently   Sexual activity: Not on file  Other Topics Concern   Not on file  Social History Narrative   Not on file   Social Determinants of Health   Financial Resource Strain: Not on file  Food Insecurity: No Food Insecurity (10/05/2022)   Hunger Vital Sign    Worried About Running Out of Food in the Last Year: Never true    Ran Out of Food in the Last Year: Never true  Transportation Needs: No Transportation Needs (10/05/2022)    PRAPARE - Administrator, Civil Service (Medical): No    Lack of Transportation (Non-Medical): No  Physical Activity: Not on file  Stress: Not on file  Social Connections: Not on file  Intimate Partner Violence: Not At Risk (10/05/2022)   Humiliation, Afraid, Rape, and Kick questionnaire    Fear of Current or Ex-Partner: No    Emotionally Abused: No    Physically Abused: No    Sexually Abused: No     Allergies  Allergen Reactions   Milk Protein Anaphylaxis   Penicillin G Swelling   Milk-Related Compounds    Other    Peanut-Containing Drug Products    Penicillins      CBC    Component Value Date/Time   WBC 11.8 (H) 10/12/2022 1656   RBC 5.18 (H) 10/12/2022 1656   HGB 13.4 10/12/2022 1656   HGB 11.1 (L) 07/18/2013 2128   HCT 45.6 10/12/2022 1656   HCT 34.4 (L) 07/18/2013 2128   PLT 237 10/12/2022 1656   PLT 227 07/18/2013 2128   MCV 88.0 10/12/2022 1656   MCV 76 (L) 07/18/2013 2128   MCH 25.9 (L) 10/12/2022 1656   MCHC 29.4 (L) 10/12/2022 1656   RDW 13.1 10/12/2022 1656   RDW 15.2 (H) 07/18/2013 2128   LYMPHSABS 1.8 10/12/2022 1656   MONOABS 0.7 10/12/2022 1656   EOSABS 0.1 10/12/2022 1656   BASOSABS 0.0 10/12/2022 1656    Pulmonary Functions Testing Results:    Latest Ref Rng & Units 11/29/2022   11:30 AM  PFT Results  FVC-Pre L 0.86   FVC-Predicted Pre % 22   FVC-Post L 1.06   FVC-Predicted Post % 27   Pre FEV1/FVC % % 88   Post FEV1/FCV % % 90   FEV1-Pre L 0.76   FEV1-Predicted Pre % 25   FEV1-Post L 0.96   DLCO uncorrected ml/min/mmHg 12.02   DLCO UNC% % 52   DLVA Predicted % 132   TLC L 2.35   TLC % Predicted % 42   RV %  Predicted % 74     Outpatient Medications Prior to Visit  Medication Sig Dispense Refill   albuterol (PROVENTIL HFA;VENTOLIN HFA) 108 (90 Base) MCG/ACT inhaler Inhale 2-4 puffs by mouth every 4 hours as needed for wheezing, cough, and/or shortness of breath 1 Inhaler 1   amLODipine (NORVASC) 10 MG tablet Take 10 mg  by mouth daily.     fluticasone-salmeterol (ADVAIR) 250-50 MCG/ACT AEPB Inhale 1 puff into the lungs 2 (two) times daily.     furosemide (LASIX) 40 MG tablet Take 40 mg by mouth daily.     solifenacin (VESICARE) 10 MG tablet Take 10 mg by mouth daily.     budesonide-formoterol (SYMBICORT) 80-4.5 MCG/ACT inhaler Inhale 2 puffs into the lungs in the morning and at bedtime. 1 each 12   escitalopram (LEXAPRO) 10 MG tablet Take 10 mg by mouth daily. (Patient not taking: Reported on 02/20/2023)     pantoprazole (PROTONIX) 40 MG tablet Take 40 mg by mouth daily. (Patient not taking: Reported on 02/20/2023)     hydrocortisone 1 % ointment Apply 1 Application topically 2 (two) times daily. (Patient not taking: Reported on 02/20/2023) 30 g 0   Miconazole Nitrate 2 % OINT Apply 1 Application topically 2 (two) times daily. Apply until rash is gone and for 1 week thereafter (Patient not taking: Reported on 02/20/2023) 56 g 0   No facility-administered medications prior to visit.

## 2023-02-21 ENCOUNTER — Encounter: Payer: Self-pay | Admitting: Neurology

## 2023-04-11 ENCOUNTER — Ambulatory Visit (INDEPENDENT_AMBULATORY_CARE_PROVIDER_SITE_OTHER): Payer: BC Managed Care – PPO | Admitting: Neurology

## 2023-04-11 ENCOUNTER — Encounter: Payer: Self-pay | Admitting: Neurology

## 2023-04-11 VITALS — BP 160/89 | HR 85 | Ht 67.0 in | Wt 323.2 lb

## 2023-04-11 DIAGNOSIS — R293 Abnormal posture: Secondary | ICD-10-CM

## 2023-04-11 DIAGNOSIS — R292 Abnormal reflex: Secondary | ICD-10-CM | POA: Diagnosis not present

## 2023-04-11 DIAGNOSIS — J986 Disorders of diaphragm: Secondary | ICD-10-CM

## 2023-04-11 NOTE — Patient Instructions (Signed)
MRI cervical spine will be ordered  We will notify you of the results

## 2023-04-11 NOTE — Progress Notes (Signed)
[facility] Neurology Division Clinic Note - Initial Visit   Date: 04/11/2023   Peggy Villa MRN: [id number] DOB: [birth year]   Dear Dr. Aundria Rud:  Thank you for your kind referral of Peggy Villa for consultation of diaphragmatic weakness. Although her history is well known to you, please allow [place] to reiterate it for the purpose of our medical record. The patient was accompanied to the clinic by self.     Peggy Villa is a 57 y.o. female with hypertension, hypothyroidism, and GERD presenting for evaluation of left diaphragmatic paralysis.   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  Her CXR from 2006 shows elevation of the left hemidiaphragm, so it is difficult to determine how long this has been present.  Today on exam, there is no evidence of other neuromuscular weakness.   To be complete, will check MRI cervical spine.  If imaging does not show any compressive nerve pathology at the C3, C4, or C5 level then management remains supportive.  ------------------------------------------------------------- History of present illness: In December 2023, she began having exertional shortness of breath and was found to have hypoxia which prompted her to got to the ER in January 2024.  CT chest showed chronically elevated left hemidiaphragm.  Of note, this was also seen on CXR from 2006.   She denies any arm/leg weakness, difficulty swallowing/talking, or numbness/tingling.  She reports being very active and previously had two jobs.  Until 2022, she was playing softball during the summer.  However, with her shortness of breath, she has slowed down her activities. 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   Out-side paper records, electronic medical record, and images have been reviewed where available and summarized as:  CT abdomen pelvis w contrast 10/12/2022: 1. No evidence of pulmonary embolism. 2. Segmental  consolidation within the left upper lobe with subsequent volume loss within the left lung. 3. Marked severity elevation of the left hemidiaphragm, increased in severity when compared to the prior study. 4. Moderate severity lingular and mild left basilar atelectasis. 5. Stable enlargement of the left lobe of the thyroid gland. No follow-up imaging is recommended. 6. Stable left adrenal adenoma. No follow-up imaging is recommended.  Lab Results  Component Value Date   HGBA1C 6.5 (H) 10/05/2022   No results found for: "VITAMINB12" Lab Results  Component Value Date   TSH 0.222 (L) 10/05/2022   No results found for: "ESRSEDRATE", "POCTSEDRATE"  Past Medical History:  Diagnosis Date   Asthma    Essential hypertension 09/11/2014   Hypertension    Hypothyroidism 09/11/2014   Obesity    Shoulder pain 12/02/2016    Past Surgical History:  Procedure Laterality Date   BREAST REDUCTION SURGERY     REDUCTION MAMMAPLASTY Bilateral 2006   TUBAL LIGATION       Medications:  Outpatient Encounter Medications as of 04/11/2023  Medication Sig   albuterol (PROVENTIL HFA;VENTOLIN HFA) 108 (90 Base) MCG/ACT inhaler Inhale 2-4 puffs by mouth every 4 hours as needed for wheezing, cough, and/or shortness of breath   amLODipine (NORVASC) 10 MG tablet Take 10 mg by mouth daily.   fluticasone-salmeterol (ADVAIR) 250-50 MCG/ACT AEPB Inhale 1 puff into the lungs 2 (two) times daily.   furosemide (LASIX) 40 MG tablet Take 40 mg by mouth daily.   escitalopram (LEXAPRO) 10 MG tablet Take 10 mg by mouth daily. (Patient not taking: Reported on 02/20/2023)   pantoprazole (PROTONIX) 40 MG tablet Take 40 mg by  mouth daily. (Patient not taking: Reported on 02/20/2023)   solifenacin (VESICARE) 10 MG tablet Take 10 mg by mouth daily. (Patient not taking: Reported on 04/11/2023)   No facility-administered encounter medications on file as of 04/11/2023.    Allergies:  Allergies  Allergen Reactions   Milk Protein  Anaphylaxis   Penicillin G Swelling   Milk-Related Compounds    Other    Peanut-Containing Drug Products    Penicillins     Family History: Family History  Problem Relation Age of Onset   Lupus Mother    Cancer Father    Breast cancer Neg Hx    Prostate cancer Neg Hx    Bladder Cancer Neg Hx    Kidney cancer Neg Hx     Social History: Social History   Tobacco Use   Smoking status: Former    Packs/day: 0.25    Years: 10.00    Additional pack years: 0.00    Total pack years: 2.50    Types: Cigarettes    Quit date: 2019    Years since quitting: 5.5   Smokeless tobacco: Never  Vaping Use   Vaping Use: Never used  Substance Use Topics   Alcohol use: Yes    Comment: socially   Drug use: Not Currently   Social History   Social History Narrative   Not on file    Vital Signs:  BP (!) 160/89   Pulse 85   Ht 5\' 7"  (1.702 m)   Wt (!) 323 lb 3.2 oz (146.6 kg)   LMP 02/01/2016   SpO2 95%   BMI 50.62 kg/m   Neurological Exam: MENTAL STATUS including orientation to time, place, person, recent and remote memory, attention span and concentration, language, and fund of knowledge is normal.  Speech is not dysarthric.  CRANIAL NERVES: II:  No visual field defects.     III-IV-VI: Pupils equal round and reactive to light.  Normal conjugate, extra-ocular eye movements in all directions of gaze.  No nystagmus.  No ptosis.   V:  Normal facial sensation.    VII:  Normal facial symmetry and movements.   VIII:  Normal hearing and vestibular function.   IX-X:  Normal palatal movement.   XI:  Normal shoulder shrug and head rotation.   XII:  Normal tongue strength and range of motion, no deviation or fasciculation.  MOTOR:  No atrophy, fasciculations or abnormal movements.  No pronator drift.   Upper Extremity:  Right  Left  Deltoid  5/5   5/5   Biceps  5/5   5/5   Triceps  5/5   5/5   Wrist extensors  5/5   5/5   Wrist flexors  5/5   5/5   Finger extensors  5/5   5/5    Finger flexors  5/5   5/5   Dorsal interossei  5/5   5/5   Abductor pollicis  5/5   5/5   Tone (Ashworth scale)  0  0   Lower Extremity:  Right  Left  Hip flexors  5/5   5/5   Knee extensors  5/5   5/5   Dorsiflexors  5/5   5/5   Plantarflexors  5/5   5/5   Tone (Ashworth scale)  0  0   MSRs:  Right        Left brachioradialis 2+  2+  biceps 2+  2+  triceps 2+  2+  patellar 3+  3+  ankle jerk 2+  2+  Hoffman no  no  plantar response down  down   SENSORY:  Normal and symmetric perception of light touch, vibration, and temperature  COORDINATION/GAIT: Normal finger-to- nose-finger.  Intact rapid alternating movements bilaterally.   Gait narrow based and stable. Tandem and stressed gait intact.     Thank you for allowing me to participate in patient's care.  If I can answer any additional questions, I would be pleased to do so.    Sincerely,    Hasten Sweitzer K. Allena Katz, DO

## 2023-06-06 ENCOUNTER — Ambulatory Visit
Admission: EM | Admit: 2023-06-06 | Discharge: 2023-06-06 | Disposition: A | Discharge status: Home / Self Care | Payer: BC Managed Care – PPO | Attending: Emergency Medicine | Admitting: Emergency Medicine

## 2023-06-06 DIAGNOSIS — T7840XA Allergy, unspecified, initial encounter: Secondary | ICD-10-CM

## 2023-06-06 MED ORDER — PREDNISONE 10 MG (21) PO TBPK: ORAL_TABLET | ORAL | 0 refills | Status: DC

## 2023-06-06 MED ORDER — FAMOTIDINE 20 MG PO TABS
20.0000 mg | ORAL_TABLET | Freq: Once | ORAL | Status: AC
  Administered 2023-06-06: 20 mg via ORAL

## 2023-06-06 MED ORDER — EPINEPHRINE 0.3 MG/0.3ML IJ SOAJ: 0.3000 mg | INTRAMUSCULAR | 0 refills | Status: AC | PRN

## 2023-06-06 MED ORDER — DEXAMETHASONE SODIUM PHOSPHATE 10 MG/ML IJ SOLN
10.0000 mg | Freq: Once | INTRAMUSCULAR | Status: AC
  Administered 2023-06-06: 10 mg via INTRAMUSCULAR

## 2023-06-06 MED ORDER — DIPHENHYDRAMINE HCL 50 MG PO CAPS
50.0000 mg | ORAL_CAPSULE | Freq: Once | ORAL | Status: AC
  Administered 2023-06-06: 50 mg via ORAL

## 2023-06-06 NOTE — Discharge Instructions (Addendum)
Take over-the-counter Allegra 180 mg daily or Zyrtec or Claritin 10 mg daily to help with your itching.  You can take over-the-counter Benadryl, 50 mg at bedtime, as needed for itching and sleep.  Take the prednisone pack according to the package instructions.  You will taken on tapering dose over a period of 6 days.  Take it with food and always take it first in the morning with breakfast.  Take over-the-counter Pepcid 20 mg twice daily to help with itching as well.  If you develop any swelling of your lips or tongue, tightness in your throat, or difficulty breathing you need to go to the ER for evaluation.  Give yourself a dose of epinephrine using the autoinjector and then call 911.

## 2023-06-06 NOTE — ED Provider Notes (Addendum)
[facility]    CSN: [id number] Arrival date & time: 06/06/23  1044      History   Chief Complaint Chief Complaint  Patient presents with   Allergic Reaction    HPI Peggy Villa is a 57 y.o. female.   HPI  57 year old female with a past medical history significant for hypertension, asthma, hypothyroidism, and obesity presents for evaluation of facial swelling that started at 4 AM this morning.  She reports that she woke up with her face itching and noticed that her eyes were swollen.  She was also experiencing some tightness in her throat at that time as well as some tingling in her lips.  She took 50 mg of Benadryl.  She is unsure what she may be reacting to though she is suspecting it might be accommodation of blueberries and muscular in grapes as that is the only new exposure that she can think of.  She is unaware of any previous allergies to either of those fruits.  She also endorses mild shortness of breath at outset of symptoms but not at present.  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       Home Medications    Prior to Admission medications   Medication Sig Start Date End Date Taking? Authorizing Provider  albuterol (PROVENTIL HFA;VENTOLIN HFA) 108 (90 Base) MCG/ACT inhaler Inhale 2-4 puffs by mouth every 4 hours as needed for wheezing, cough, and/or shortness of breath 12/26/18  Yes Loleta Rose, MD  amLODipine (NORVASC) 10 MG tablet Take 10 mg by mouth daily. 04/12/22  Yes [provider]  EPINEPHrine 0.3 mg/0.3 mL IJ SOAJ injection Inject 0.3 mg into the muscle as needed for anaphylaxis. 06/06/23  Yes Becky Augusta, NP  fluticasone-salmeterol (ADVAIR) 250-50 MCG/ACT AEPB Inhale 1 puff into the lungs 2 (two) times daily. 11/30/22  Yes [provider]  predniSONE (STERAPRED UNI-PAK 21 TAB) 10 MG (21) TBPK tablet Take 6 tablets on day 1, 5 tablets day 2, 4 tablets day 3, 3 tablets day 4, 2 tablets day 5, 1 tablet day 6 06/06/23  Yes Becky Augusta, NP  escitalopram (LEXAPRO) 10 MG tablet Take 10 mg by mouth daily. Patient not taking: Reported on 02/20/2023    [provider]  furosemide (LASIX) 40 MG tablet Take 40 mg by mouth daily. 01/31/23   [provider]  pantoprazole (PROTONIX) 40 MG tablet Take 40 mg by mouth daily. Patient not taking: Reported on 02/20/2023 04/12/22   [provider]  solifenacin (VESICARE) 10 MG tablet Take 10 mg by mouth daily.  Patient not taking: Reported on 04/11/2023 04/12/22   [provider]    Family History Family History  Problem Relation Age of Onset   Lupus Mother    Cancer Father    Breast cancer Neg Hx    Prostate cancer Neg Hx    Bladder Cancer Neg Hx    Kidney cancer Neg Hx     Social History Social History   Tobacco Use   Smoking status: Former    Current packs/day: 0.00    Average packs/day: 0.3 packs/day for 10.0 years (2.5 ttl pk-yrs)    Types: Cigarettes    Start date: 2009    Quit date: 2019    Years since quitting: 5.6   Smokeless tobacco: Never  Vaping Use   Vaping  status: Never Used  Substance Use Topics   Alcohol use: Yes    Comment: socially   Drug use: Not Currently     Allergies   Milk protein, Penicillin g, Milk-related compounds, Other, Peanut-containing drug products, and Penicillins   Review of Systems Review of Systems  HENT:  Positive for facial swelling and trouble swallowing.   Respiratory:  Positive for shortness of breath. Negative for wheezing.      Physical Exam Triage Vital Signs ED Triage Vitals  Encounter Vitals Group     BP      Systolic BP Percentile      Diastolic BP Percentile      Pulse      Resp      Temp      Temp src      SpO2      Weight      Height      Head Circumference      Peak Flow      Pain Score      Pain Loc      Pain Education      Exclude from Growth Chart    No data found.  Updated Vital Signs BP (!) 203/106 (BP Location: Right Wrist)   Pulse 84   Temp 98.4 F (36.9 C) (Oral)   Resp 18   Wt (!) 310 lb (140.6 kg)   LMP 02/01/2016   SpO2 97%   BMI 48.55 kg/m   Visual Acuity Right Eye Distance:   Left Eye Distance:   Bilateral Distance:    Right Eye Near:   Left Eye Near:    Bilateral Near:     Physical Exam Vitals and nursing note reviewed.  Constitutional:      Appearance: Normal appearance. She is not ill-appearing.  HENT:     Head: Normocephalic and atraumatic.     Mouth/Throat:     Mouth: Mucous membranes are moist.     Pharynx: Oropharynx is clear. No oropharyngeal exudate or posterior oropharyngeal erythema.     Comments: Oropharyngeal exam reveals patent airway.  No erythema or edema noted to the soft tissues. Cardiovascular:     Rate and Rhythm: Normal rate and regular rhythm.     Pulses: Normal pulses.     Heart sounds: Normal heart sounds. No murmur heard.    No friction rub. No gallop.  Pulmonary:     Effort: Pulmonary effort is normal.     Breath sounds: Normal breath sounds. No stridor. No wheezing, rhonchi or rales.  Musculoskeletal:      Cervical back: Normal range of motion and neck supple.  Skin:    General: Skin is warm and dry.     Capillary  Refill: Capillary refill takes less than 2 seconds.     Findings: No rash.  Neurological:     General: No focal deficit present.     Mental Status: She is alert and oriented to person, place, and time.      UC Treatments / Results  Labs (all labs ordered are listed, but only abnormal results are displayed) Labs Reviewed - No data to display  EKG   Radiology No results found.  Procedures Procedures (including critical care time)  Medications Ordered in UC Medications  dexamethasone (DECADRON) injection 10 mg (has no administration in time range)  diphenhydrAMINE (BENADRYL) capsule 50 mg (has no administration in time range)  famotidine (PEPCID) tablet 20 mg (has no administration in time range)    Initial Impression / Assessment and Plan / UC Course  I have reviewed the triage vital signs and the nursing notes.  Pertinent labs & imaging results that were available during my care of the patient were reviewed by me and considered in my medical decision making (see chart for details).   Patient is a pleasant, nontoxic-appearing 58 old female presenting for evaluation of allergic reaction to either possibly blueberries or grapes.  Her symptoms began around 4 AM and she did take 50 mg of p.o. Benadryl at that time.  Her lip tingling and throat tightness have resolved, as has her shortness of breath though she still has swelling around her eyes.   No appreciable rashes, lesions, or hives.  No swelling of her lips or tongue present on exam and she has a patent airway.  No stridor when auscultating over the trachea.  Lung sounds are clear to auscultation all fields.  I will treat the patient for an acute allergic reaction with 10 mg of IM Decadron, 50 mg of p.o. Benadryl, and 20 mg of p.o. Pepcid.  Patient's vital signs show hypertension with a BP of 203/106.  Heart rate is  normal at 84 and room air oxygen saturation is 97%.  Respiratory rate at triage is 18.  Hypertension is most likely secondary to the Benadryl.  She is to continue dual antihistamine therapy twice daily for the next 6 days along with prednisone taper.  I am also prescribing EpiPen for her.  If she has a rebound of symptoms such as lip swelling, tongue swelling, throat tightness, or shortness of breath she has been instructed to give herself a dose of epinephrine and call 911.  Final Clinical Impressions(s) / UC Diagnoses   Final diagnoses:  Allergic reaction, initial encounter     Discharge Instructions      Take over-the-counter Allegra 180 mg daily or Zyrtec or Claritin 10 mg daily to help with your itching.  You can take over-the-counter Benadryl, 50 mg at bedtime, as needed for itching and sleep.  Take the prednisone pack according to the package instructions.  You will taken on tapering dose over a period of 6 days.  Take it with food and always take it first in the morning with breakfast.  Take over-the-counter Pepcid 20 mg twice daily to help with itching as well.  If you develop any swelling of your lips or tongue, tightness in your throat, or difficulty breathing you need to go to the ER for evaluation.  Give yourself a dose of epinephrine using the autoinjector and then call 911.     ED Prescriptions     Medication Sig Dispense Auth. Provider   predniSONE (STERAPRED UNI-PAK 21 TAB) 10 MG (21) TBPK tablet  Take 6 tablets on day 1, 5 tablets day 2, 4 tablets day 3, 3 tablets day 4, 2 tablets day 5, 1 tablet day 6 21 tablet Becky Augusta, NP   EPINEPHrine 0.3 mg/0.3 mL IJ SOAJ injection Inject 0.3 mg into the muscle as needed for anaphylaxis. 1 each Becky Augusta, NP      PDMP not reviewed this encounter.   Becky Augusta, NP 06/06/23 1125    Becky Augusta, NP 06/06/23 [phone]

## 2023-06-06 NOTE — ED Triage Notes (Signed)
Patient having allergic reaction. Unsure if its from grapes or muscadine. Took benadryl this morning.

## 2023-08-16 ENCOUNTER — Ambulatory Visit
Admission: EM | Admit: 2023-08-16 | Discharge: 2023-08-16 | Disposition: A | Discharge status: Home / Self Care | Payer: BC Managed Care – PPO

## 2023-08-16 ENCOUNTER — Ambulatory Visit: Payer: BC Managed Care – PPO

## 2023-08-16 DIAGNOSIS — R0602 Shortness of breath: Secondary | ICD-10-CM | POA: Diagnosis not present

## 2023-08-16 DIAGNOSIS — J441 Chronic obstructive pulmonary disease with (acute) exacerbation: Secondary | ICD-10-CM | POA: Diagnosis not present

## 2023-08-16 DIAGNOSIS — J019 Acute sinusitis, unspecified: Secondary | ICD-10-CM | POA: Diagnosis not present

## 2023-08-16 MED ORDER — PREDNISONE 20 MG PO TABS: 40.0000 mg | ORAL_TABLET | Freq: Every day | ORAL | 0 refills | Status: AC

## 2023-08-16 MED ORDER — IPRATROPIUM-ALBUTEROL 0.5-2.5 (3) MG/3ML IN SOLN
3.0000 mL | Freq: Once | RESPIRATORY_TRACT | Status: AC
  Administered 2023-08-16: 3 mL via RESPIRATORY_TRACT

## 2023-08-16 MED ORDER — DOXYCYCLINE HYCLATE 100 MG PO CAPS: 100.0000 mg | ORAL_CAPSULE | Freq: Two times a day (BID) | ORAL | 0 refills | Status: AC

## 2023-08-16 NOTE — ED Triage Notes (Signed)
Pt c/o sinus pressure & facial pain x1 wk. Has tried tylenol w/o relief.

## 2023-08-16 NOTE — Discharge Instructions (Addendum)
-  I did not see evidence of pneumonia on your x-ray but I will call you if the radiologist does. - I sent antibiotics to pharmacy for flareup of COPD and a sinus infection. - I also sent prescription for prednisone in case you feel that your breathing is not improving by using your inhalers at home. - If fever or worsening breathing please go to the ER.

## 2023-08-16 NOTE — ED Provider Notes (Signed)
[facility]    CSN: [id number] Arrival date & time: 08/16/23  1043      History   Chief Complaint Chief Complaint  Patient presents with   Sinus Problem   Facial Pain    HPI Peggy Villa is a 57 y.o. 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  Patient presents today for sinus pressure, congestion and shortness of breath for 1 week.  Denies fever, fatigue, chest pain, cough.  Patient says she feels like when she has been she had walking pneumonia in the past.  She has been using inhalers more than normal at home.  She has also been taking Tylenol without relief.  Patient has a follow-up with pulmonology next week regarding the diaphragm paralysis and shortness of breath.  HPI  Past Medical History:  Diagnosis Date   Asthma    Essential hypertension 09/11/2014   Hypertension    Hypothyroidism 09/11/2014   Obesity    Shoulder pain 12/02/2016    Patient Active Problem List   Diagnosis Date Noted   Elevated hemoglobin A1c 10/06/2022   Low TSH level 10/06/2022   Diaphragmatic paralysis 10/06/2022   T2DM (type 2 diabetes mellitus) (HCC) 10/06/2022   Asthma exacerbation 10/04/2022   Acute respiratory failure with hypoxia (HCC) 10/04/2022   Hypertensive urgency 10/04/2022   GERD without esophagitis 10/04/2022   Depression 10/04/2022   Tobacco abuse 10/04/2022   Shoulder pain 12/02/2016   Essential hypertension 09/11/2014   Hypothyroidism 09/11/2014   Urinary symptom or sign 09/11/2014    Past Surgical History:  Procedure Laterality Date   BREAST REDUCTION SURGERY     REDUCTION MAMMAPLASTY Bilateral 2006   TUBAL LIGATION      OB History   No obstetric history on file.      Home Medications    Prior to Admission medications   Medication Sig Start Date End Date Taking? Authorizing Provider  albuterol (PROVENTIL HFA;VENTOLIN HFA) 108 (90 Base) MCG/ACT inhaler Inhale 2-4 puffs by mouth every 4  hours as needed for wheezing, cough, and/or shortness of breath 12/26/18  Yes Loleta Rose, MD  amLODipine (NORVASC) 10 MG tablet Take 10 mg by mouth daily. 04/12/22  Yes [provider]  doxycycline (VIBRAMYCIN) 100 MG capsule Take 1 capsule (100 mg total) by mouth 2 (two) times daily for 7 days. 08/16/23 08/23/23 Yes Eusebio Friendly B, PA-C  EPINEPHrine 0.3 mg/0.3 mL IJ SOAJ injection Inject 0.3 mg into the muscle as needed for anaphylaxis. 06/06/23  Yes Becky Augusta, NP  fluticasone-salmeterol (ADVAIR) 250-50 MCG/ACT AEPB Inhale 1 puff into the lungs 2 (two) times daily. 11/30/22  Yes [provider]  furosemide (LASIX) 40 MG tablet Take 40 mg by mouth daily. 01/31/23  Yes [provider]  NIFEdipine (PROCARDIA XL/NIFEDICAL XL) 60 MG 24 hr tablet Take 60 mg by mouth daily. 07/11/23  Yes [provider]  predniSONE (DELTASONE) 20 MG tablet Take 2 tablets (40 mg total) by mouth daily for 5 days. 08/16/23 08/21/23 Yes Eusebio Friendly B, PA-C  escitalopram (LEXAPRO) 10 MG tablet Take 10 mg by mouth daily. Patient not taking: Reported on 02/20/2023    [provider]  pantoprazole (PROTONIX) 40 MG tablet Take 40 mg by mouth daily. Patient not taking: Reported on 02/20/2023 04/12/22   [provider]  solifenacin (VESICARE) 10 MG tablet Take 10 mg by mouth daily. Patient not taking: Reported on 04/11/2023 04/12/22   [provider]  Family History Family History  Problem Relation Age of Onset   Lupus Mother    Cancer Father    Breast cancer Neg Hx    Prostate cancer Neg Hx    Bladder Cancer Neg Hx    Kidney cancer Neg Hx     Social History Social History   Tobacco Use   Smoking status: Former    Current packs/day: 0.00    Average packs/day: 0.3 packs/day for 10.0 years (2.5 ttl pk-yrs)    Types: Cigarettes    Start date: 2009    Quit date: 2019    Years since quitting: 5.8   Smokeless tobacco: Never  Vaping Use   Vaping status:  Never Used  Substance Use Topics   Alcohol use: Yes    Comment: socially   Drug use: Not Currently     Allergies   Milk protein, Penicillin g, Penicillins, Milk-related compounds, Other, and Peanut-containing drug products   Review of Systems Review of Systems  Constitutional:  Negative for chills, diaphoresis, fatigue and fever.  HENT:  Positive for congestion, sinus pressure and sinus pain. Negative for ear pain, rhinorrhea and sore throat.   Respiratory:  Positive for shortness of breath. Negative for cough.   Cardiovascular:  Negative for chest pain.  Gastrointestinal:  Negative for abdominal pain, nausea and vomiting.  Musculoskeletal:  Negative for arthralgias and myalgias.  Skin:  Negative for rash.  Neurological:  Negative for weakness and headaches.  Hematological:  Negative for adenopathy.     Physical Exam Triage Vital Signs ED Triage Vitals  Encounter Vitals Group     BP 08/16/23 1113 (!) 177/99     Systolic BP Percentile --      Diastolic BP Percentile --      Pulse Rate 08/16/23 1113 85     Resp 08/16/23 1113 16     Temp 08/16/23 1113 98 F (36.7 C)     Temp Source 08/16/23 1113 Oral     SpO2 08/16/23 1113 97 %     Weight 08/16/23 1112 300 lb (136.1 kg)     Height 08/16/23 1112 5\' 9"  (1.753 m)     Head Circumference --      Peak Flow --      Pain Score 08/16/23 1116 8     Pain Loc --      Pain Education --      Exclude from Growth Chart --    No data found.  Updated Vital Signs BP (!) 188/96 (BP Location: Right Arm)   Pulse 85   Temp 98 F (36.7 C) (Oral)   Resp 16   Ht 5\' 9"  (1.753 m)   Wt 300 lb (136.1 kg)   LMP 02/01/2016   SpO2 95%   BMI 44.30 kg/m       Physical Exam Vitals and nursing note reviewed.  Constitutional:      General: She is not in acute distress.    Appearance: Normal appearance. She is obese. She is not ill-appearing or toxic-appearing.  HENT:     Head: Normocephalic and atraumatic.     Right Ear: Tympanic  membrane, ear canal and external ear normal.     Left Ear: Tympanic membrane, ear canal and external ear normal.     Nose: Congestion present.     Mouth/Throat:     Mouth: Mucous membranes are moist.     Pharynx: Oropharynx is clear.  Eyes:     General: No scleral icterus.  Right eye: No discharge.        Left eye: No discharge.     Conjunctiva/sclera: Conjunctivae normal.  Cardiovascular:     Rate and Rhythm: Normal rate and regular rhythm.     Heart sounds: Normal heart sounds.  Pulmonary:     Effort: Respiratory distress present.     Comments: No breath sounds heard to left lung base Musculoskeletal:     Cervical back: Neck supple.  Skin:    General: Skin is dry.  Neurological:     General: No focal deficit present.     Mental Status: She is alert. Mental status is at baseline.     Motor: No weakness.     Gait: Gait normal.  Psychiatric:        Mood and Affect: Mood normal.        Behavior: Behavior normal.      UC Treatments / Results  Labs (all labs ordered are listed, but only abnormal results are displayed) Labs Reviewed - No data to display  EKG   Radiology DG Chest 2 View  Result Date: 08/16/2023 CLINICAL DATA:  Shortness of breath. EXAM: CHEST - 2 VIEW COMPARISON:  October 12, 2022. FINDINGS: Stable cardiomediastinal silhouette. Stable marked elevation of left hemidiaphragm with minimal adjacent subsegmental atelectasis. Right lung is clear. Bony thorax is unremarkable. IMPRESSION: Stable marked elevation of left hemidiaphragm with minimal adjacent left basilar subsegmental atelectasis. Electronically Signed   By: Lupita Raider M.D.   On: 08/16/2023 14:45    Procedures Procedures (including critical care time)  Medications Ordered in UC Medications  ipratropium-albuterol (DUONEB) 0.5-2.5 (3) MG/3ML nebulizer solution 3 mL (3 mLs Nebulization Given 08/16/23 1149)    Initial Impression / Assessment and Plan / UC Course  I have reviewed the triage  vital signs and the nursing notes.  Pertinent labs & imaging results that were available during my care of the patient were reviewed by me and considered in my medical decision making (see chart for details).   57 year old female with history of COPD, asthma, hypertension, left diaphragmatic paralysis presents for sinus congestion/pressure and shortness of breath for the past 1 week.  Denies fever, cough, chest pain.  Patient is afebrile.  She is in mild respiratory distress.  Patient pauses to take deep breaths at times when speaking.  No breath sounds heard of the left lower lung base.  No wheezing or rhonchi noted.  Patient given DuoNeb treatment.  Chest x-ray obtained to assess for possible walking pneumonia given patient's concerns regarding this and her chronic medical conditions.  Treating for acute sinusitis/COPD exacerbation with doxycycline and prednisone.  Also encouraged increasing rest and fluids and continue to follow-up with pulmonology and neurology regarding the diaphragmatic paralysis.  Reviewed ED precautions.  Chest x-ray overread without pneumonia.   Final Clinical Impressions(s) / UC Diagnoses   Final diagnoses:  COPD exacerbation (HCC)  Acute sinusitis, recurrence not specified, unspecified location  Shortness of breath     Discharge Instructions      -I did not see evidence of pneumonia on your x-ray but I will call you if the radiologist does. - I sent antibiotics to pharmacy for flareup of COPD and a sinus infection. - I also sent prescription for prednisone in case you feel that your breathing is not improving by using your inhalers at home. - If fever or worsening breathing please go to the ER.     ED Prescriptions     Medication Sig Dispense Auth.  Provider   doxycycline (VIBRAMYCIN) 100 MG capsule Take 1 capsule (100 mg total) by mouth 2 (two) times daily for 7 days. 14 capsule Eusebio Friendly B, PA-C   predniSONE (DELTASONE) 20 MG tablet Take 2  tablets (40 mg total) by mouth daily for 5 days. 10 tablet Gareth Morgan      PDMP not reviewed this encounter.   Shirlee Latch, PA-C 08/16/23 1719

## 2023-08-21 ENCOUNTER — Encounter: Payer: Self-pay | Admitting: Student in an Organized Health Care Education/Training Program

## 2023-08-21 ENCOUNTER — Ambulatory Visit
Admission: RE | Admit: 2023-08-21 | Discharge: 2023-08-21 | Disposition: A | Discharge status: Home / Self Care | Payer: BC Managed Care – PPO | Source: Ambulatory Visit | Attending: Student in an Organized Health Care Education/Training Program | Admitting: Student in an Organized Health Care Education/Training Program

## 2023-08-21 ENCOUNTER — Ambulatory Visit (INDEPENDENT_AMBULATORY_CARE_PROVIDER_SITE_OTHER): Payer: BC Managed Care – PPO | Admitting: Student in an Organized Health Care Education/Training Program

## 2023-08-21 VITALS — BP 138/86 | HR 78 | Temp 98.5°F | Ht 67.0 in | Wt 334.0 lb

## 2023-08-21 DIAGNOSIS — G4733 Obstructive sleep apnea (adult) (pediatric): Secondary | ICD-10-CM | POA: Diagnosis not present

## 2023-08-21 DIAGNOSIS — J986 Disorders of diaphragm: Secondary | ICD-10-CM

## 2023-08-21 DIAGNOSIS — J9611 Chronic respiratory failure with hypoxia: Secondary | ICD-10-CM

## 2023-08-21 DIAGNOSIS — J4540 Moderate persistent asthma, uncomplicated: Secondary | ICD-10-CM | POA: Diagnosis present

## 2023-08-21 DIAGNOSIS — Z6841 Body Mass Index (BMI) 40.0 and over, adult: Secondary | ICD-10-CM

## 2023-08-21 DIAGNOSIS — E66813 Obesity, class 3: Secondary | ICD-10-CM

## 2023-08-21 DIAGNOSIS — J9612 Chronic respiratory failure with hypercapnia: Secondary | ICD-10-CM

## 2023-08-21 MED ORDER — FLUTICASONE-SALMETEROL 250-50 MCG/ACT IN AEPB: 1.0000 | INHALATION_SPRAY | Freq: Two times a day (BID) | RESPIRATORY_TRACT | 11 refills | Status: AC

## 2023-08-21 NOTE — Progress Notes (Unsigned)
Synopsis: Referred in *** by [facility] [employer]*  Assessment & Plan:   1. Moderate persistent asthma without complication *** - Ambulatory referral to Endocrinology - CT SOFT TISSUE NECK WO CONTRAST; Future  2. Chronic respiratory failure with hypoxia and hypercapnia (HCC) *** - fluticasone-salmeterol (ADVAIR) 250-50 MCG/ACT AEPB; Inhale 1 puff into the lungs 2 (two) times daily.  Dispense: 60 each; Refill: 11  3. Diaphragmatic paralysis *** - fluticasone-salmeterol (ADVAIR) 250-50 MCG/ACT AEPB; Inhale 1 puff into the lungs 2 (two) times daily.  Dispense: 60 each; Refill: 11  Short of breath, especially with exertion. Recently at urgent care, felt better with prednisone and nebulizers, but symptoms seem to recur. Feels her goiter is larger. No fevers, chills, cough, or wheeze. Seen by neurology, MRI cervical spine not yet done.Will place an urgent referral to endocrinology for goiter management and get a stat CT of the neck to assess for any upper airway obstruction.  Return in about 4 weeks (around 09/18/2023).  I spent *** minutes caring for this patient today, including {EM billing:[id number]}  Raechel Chute, MD [facility] 08/21/2023 3:02 PM    End of visit medications:  Meds ordered this encounter  Medications   fluticasone-salmeterol (ADVAIR) 250-50 MCG/ACT AEPB    Sig: Inhale 1 puff into the lungs 2 (two) times daily.    Dispense:  60 each    Refill:  11     Current Outpatient Medications:    albuterol (PROVENTIL HFA;VENTOLIN HFA) 108 (90 Base) MCG/ACT inhaler, Inhale 2-4 puffs by mouth every 4 hours as needed for wheezing, cough, and/or shortness of breath, Disp: 1 Inhaler, Rfl: 1   amLODipine (NORVASC) 10 MG tablet, Take 10 mg by mouth daily., Disp: , Rfl:    doxycycline (VIBRAMYCIN) 100 MG capsule, Take 1 capsule (100 mg total) by mouth 2 (two) times daily for 7 days., Disp: 14 capsule, Rfl: 0   EPINEPHrine 0.3 mg/0.3 mL IJ SOAJ injection,  Inject 0.3 mg into the muscle as needed for anaphylaxis., Disp: 1 each, Rfl: 0   escitalopram (LEXAPRO) 10 MG tablet, Take 10 mg by mouth daily., Disp: , Rfl:    furosemide (LASIX) 40 MG tablet, Take 40 mg by mouth daily., Disp: , Rfl:    NIFEdipine (PROCARDIA XL/NIFEDICAL XL) 60 MG 24 hr tablet, Take 60 mg by mouth daily., Disp: , Rfl:    pantoprazole (PROTONIX) 40 MG tablet, Take 40 mg by mouth daily., Disp: , Rfl:    predniSONE (DELTASONE) 20 MG tablet, Take 2 tablets (40 mg total) by mouth daily for 5 days., Disp: 10 tablet, Rfl: 0   solifenacin (VESICARE) 10 MG tablet, Take 10 mg by mouth daily., Disp: , Rfl:    fluticasone-salmeterol (ADVAIR) 250-50 MCG/ACT AEPB, Inhale 1 puff into the lungs 2 (two) times daily., Disp: 60 each, Rfl: 11   Subjective:   PATIENT ID: Peggy Villa GENDER: female DOB: 05-19-1966, MRN: [id number]  Chief Complaint  Patient presents with   Follow-up    SOB with exertion and wheezing    HPI ***  Ancillary information including prior medications, full medical/surgical/family/social histories, and PFTs (when available) are listed below and have been reviewed.   ROS   Objective:   Vitals:   08/21/23 1449  BP: 138/86  Pulse: 78  Temp: 98.5 F (36.9 C)  TempSrc: Oral  SpO2: 98%  Weight: (!) 334 lb (151.5 kg)  Height: 5\' 7"  (1.702 m)   98% on *** LPM *** RA BMI Readings from  Last 3 Encounters:  08/21/23 52.31 kg/m  08/16/23 44.30 kg/m  06/06/23 48.55 kg/m   Wt Readings from Last 3 Encounters:  08/21/23 (!) 334 lb (151.5 kg)  08/16/23 300 lb (136.1 kg)  06/06/23 (!) 310 lb (140.6 kg)    Physical Exam    Ancillary Information    Past Medical History:  Diagnosis Date   Asthma    Essential hypertension 09/11/2014   Hypertension    Hypothyroidism 09/11/2014   Obesity    Shoulder pain 12/02/2016     Family History  Problem Relation Age of Onset   Lupus Mother    Cancer Father    Breast cancer Neg Hx    Prostate cancer  Neg Hx    Bladder Cancer Neg Hx    Kidney cancer Neg Hx      Past Surgical History:  Procedure Laterality Date   BREAST REDUCTION SURGERY     REDUCTION MAMMAPLASTY Bilateral 2006   TUBAL LIGATION      Social History   Socioeconomic History   Marital status: Widowed    Spouse name: Not on file   Number of children: Not on file   Years of education: Not on file   Highest education level: Not on file  Occupational History   Not on file  Tobacco Use   Smoking status: Former    Current packs/day: 0.00    Average packs/day: 0.3 packs/day for 10.0 years (2.5 ttl pk-yrs)    Types: Cigarettes    Start date: 2009    Quit date: 2019    Years since quitting: 5.8   Smokeless tobacco: Never  Vaping Use   Vaping status: Never Used  Substance and Sexual Activity   Alcohol use: Yes    Comment: socially   Drug use: Not Currently   Sexual activity: Not on file  Other Topics Concern   Not on file  Social History Narrative   Not on file   Social Determinants of Health   Financial Resource Strain: Not on file  Food Insecurity: No Food Insecurity (10/05/2022)   Hunger Vital Sign    Worried About Running Out of Food in the Last Year: Never true    Ran Out of Food in the Last Year: Never true  Transportation Needs: No Transportation Needs (10/05/2022)   PRAPARE - Administrator, Civil Service (Medical): No    Lack of Transportation (Non-Medical): No  Physical Activity: Not on file  Stress: Not on file  Social Connections: Not on file  Intimate Partner Violence: Not At Risk (10/05/2022)   Humiliation, Afraid, Rape, and Kick questionnaire    Fear of Current or Ex-Partner: No    Emotionally Abused: No    Physically Abused: No    Sexually Abused: No     Allergies  Allergen Reactions   Milk Protein Anaphylaxis   Penicillin G Swelling   Penicillins Swelling   Milk-Related Compounds    Other Other (See Comments)   Peanut-Containing Drug Products      CBC    Component  Value Date/Time   WBC 11.8 (H) 10/12/2022 1656   RBC 5.18 (H) 10/12/2022 1656   HGB 13.4 10/12/2022 1656   HGB 11.1 (L) 07/18/2013 2128   HCT 45.6 10/12/2022 1656   HCT 34.4 (L) 07/18/2013 2128   PLT 237 10/12/2022 1656   PLT 227 07/18/2013 2128   MCV 88.0 10/12/2022 1656   MCV 76 (L) 07/18/2013 2128   MCH 25.9 (L) 10/12/2022 1656  MCHC 29.4 (L) 10/12/2022 1656   RDW 13.1 10/12/2022 1656   RDW 15.2 (H) 07/18/2013 2128   LYMPHSABS 1.8 10/12/2022 1656   MONOABS 0.7 10/12/2022 1656   EOSABS 0.1 10/12/2022 1656   BASOSABS 0.0 10/12/2022 1656    Pulmonary Functions Testing Results:    Latest Ref Rng & Units 11/29/2022   11:30 AM  PFT Results  FVC-Pre L 0.86   FVC-Predicted Pre % 22   FVC-Post L 1.06   FVC-Predicted Post % 27   Pre FEV1/FVC % % 88   Post FEV1/FCV % % 90   FEV1-Pre L 0.76   FEV1-Predicted Pre % 25   FEV1-Post L 0.96   DLCO uncorrected ml/min/mmHg 12.02   DLCO UNC% % 52   DLVA Predicted % 132   TLC L 2.35   TLC % Predicted % 42   RV % Predicted % 74     Outpatient Medications Prior to Visit  Medication Sig Dispense Refill   albuterol (PROVENTIL HFA;VENTOLIN HFA) 108 (90 Base) MCG/ACT inhaler Inhale 2-4 puffs by mouth every 4 hours as needed for wheezing, cough, and/or shortness of breath 1 Inhaler 1   amLODipine (NORVASC) 10 MG tablet Take 10 mg by mouth daily.     doxycycline (VIBRAMYCIN) 100 MG capsule Take 1 capsule (100 mg total) by mouth 2 (two) times daily for 7 days. 14 capsule 0   EPINEPHrine 0.3 mg/0.3 mL IJ SOAJ injection Inject 0.3 mg into the muscle as needed for anaphylaxis. 1 each 0   escitalopram (LEXAPRO) 10 MG tablet Take 10 mg by mouth daily.     furosemide (LASIX) 40 MG tablet Take 40 mg by mouth daily.     NIFEdipine (PROCARDIA XL/NIFEDICAL XL) 60 MG 24 hr tablet Take 60 mg by mouth daily.     pantoprazole (PROTONIX) 40 MG tablet Take 40 mg by mouth daily.     predniSONE (DELTASONE) 20 MG tablet Take 2 tablets (40 mg total) by mouth  daily for 5 days. 10 tablet 0   solifenacin (VESICARE) 10 MG tablet Take 10 mg by mouth daily.     fluticasone-salmeterol (ADVAIR) 250-50 MCG/ACT AEPB Inhale 1 puff into the lungs 2 (two) times daily.     No facility-administered medications prior to visit.

## 2023-08-28 ENCOUNTER — Telehealth: Payer: BC Managed Care – PPO | Admitting: Physician Assistant

## 2023-08-28 DIAGNOSIS — R110 Nausea: Secondary | ICD-10-CM | POA: Diagnosis not present

## 2023-08-28 DIAGNOSIS — G43809 Other migraine, not intractable, without status migrainosus: Secondary | ICD-10-CM

## 2023-08-28 DIAGNOSIS — J0101 Acute recurrent maxillary sinusitis: Secondary | ICD-10-CM | POA: Diagnosis not present

## 2023-08-28 MED ORDER — ONDANSETRON 4 MG PO TBDP
4.0000 mg | ORAL_TABLET | Freq: Three times a day (TID) | ORAL | 0 refills | Status: AC | PRN
Start: 2023-08-28 — End: ?

## 2023-08-28 MED ORDER — PREDNISONE 20 MG PO TABS
40.0000 mg | ORAL_TABLET | Freq: Every day | ORAL | 0 refills | Status: DC
Start: 2023-08-28 — End: 2023-09-14

## 2023-08-28 MED ORDER — DOXYCYCLINE HYCLATE 100 MG PO TABS: 100.0000 mg | ORAL_TABLET | Freq: Two times a day (BID) | ORAL | 0 refills | Status: DC

## 2023-08-28 NOTE — Patient Instructions (Signed)
Peggy Villa, thank you for joining [name], PA-C for today's virtual visit.  While this provider is not your primary care provider (PCP), if your PCP is located in our provider database this encounter information will be shared with them immediately following your visit.   A [facility] MyChart account gives you access to today's visit and all your visits, tests, and labs performed at [facility] " click here if you don't have a [facility] MyChart account or go to [email or web address]  Consent: (Patient) Peggy Villa provided verbal consent for this virtual visit at the beginning of the encounter.  Current Medications:  Current Outpatient Medications:    doxycycline (VIBRA-TABS) 100 MG tablet, Take 1 tablet (100 mg total) by mouth 2 (two) times daily., Disp: 14 tablet, Rfl: 0   ondansetron (ZOFRAN-ODT) 4 MG disintegrating tablet, Take 1 tablet (4 mg total) by mouth every 8 (eight) hours as needed., Disp: 20 tablet, Rfl: 0   predniSONE (DELTASONE) 20 MG tablet, Take 2 tablets (40 mg total) by mouth daily with breakfast., Disp: 10 tablet, Rfl: 0   albuterol (PROVENTIL HFA;VENTOLIN HFA) 108 (90 Base) MCG/ACT inhaler, Inhale 2-4 puffs by mouth every 4 hours as needed for wheezing, cough, and/or shortness of breath, Disp: 1 Inhaler, Rfl: 1   amLODipine (NORVASC) 10 MG tablet, Take 10 mg by mouth daily., Disp: , Rfl:    EPINEPHrine 0.3 mg/0.3 mL IJ SOAJ injection, Inject 0.3 mg into the muscle as needed for anaphylaxis., Disp: 1 each, Rfl: 0   escitalopram (LEXAPRO) 10 MG tablet, Take 10 mg by mouth daily., Disp: , Rfl:    fluticasone-salmeterol (ADVAIR) 250-50 MCG/ACT AEPB, Inhale 1 puff into the lungs 2 (two) times daily., Disp: 60 each, Rfl: 11   furosemide (LASIX) 40 MG tablet, Take 40 mg by mouth daily., Disp: , Rfl:    NIFEdipine (PROCARDIA XL/NIFEDICAL XL) 60 MG 24 hr tablet, Take 60 mg by mouth daily., Disp: , Rfl:    pantoprazole (PROTONIX) 40 MG  tablet, Take 40 mg by mouth daily., Disp: , Rfl:    solifenacin (VESICARE) 10 MG tablet, Take 10 mg by mouth daily., Disp: , Rfl:    Medications ordered in this encounter:  Meds ordered this encounter  Medications   doxycycline (VIBRA-TABS) 100 MG tablet    Sig: Take 1 tablet (100 mg total) by mouth 2 (two) times daily.    Dispense:  14 tablet    Refill:  0    Order Specific Question:   Supervising Provider    Answer:   Merrilee Jansky [[id number]]   predniSONE (DELTASONE) 20 MG tablet    Sig: Take 2 tablets (40 mg total) by mouth daily with breakfast.    Dispense:  10 tablet    Refill:  0    Order Specific Question:   Supervising Provider    Answer:   Merrilee Jansky [[id number]]   ondansetron (ZOFRAN-ODT) 4 MG disintegrating tablet    Sig: Take 1 tablet (4 mg total) by mouth every 8 (eight) hours as needed.    Dispense:  20 tablet    Refill:  0    Order Specific Question:   Supervising Provider    Answer:   Merrilee Jansky [id number]     *If you need refills on other medications prior to your next appointment, please contact your pharmacy*  Follow-Up: Call back or seek an in-person evaluation if the symptoms worsen or if the condition fails to improve as anticipated.  [facility] [phone]  Other Instructions Migraine Headache A migraine headache is an intense pulsing or throbbing pain on one or both sides of the head. Migraine headaches may also cause other symptoms, such as nausea, vomiting, and sensitivity to light and noise. A migraine headache can last from 4 hours to 3 days. Talk with your health care provider about what things may bring on (trigger) your migraine headaches. What are the causes? The exact cause is not known. However, a migraine may be caused when nerves in the brain get irritated and release chemicals that cause blood vessels to become inflamed. This inflammation causes pain. Migraines may be triggered or caused by: Smoking. Medicines,  such as: Nitroglycerin, which is used to treat chest pain. Birth control pills. Estrogen. Certain blood pressure medicines. Foods or drinks that contain nitrates, glutamate, aspartame, MSG, or tyramine. Certain foods or drinks, such as aged cheeses, chocolate, alcohol, or caffeine. Doing physical activity that is very hard. Other triggers may include: Menstruation. Pregnancy. Hunger. Stress. Getting too much or too little sleep. Weather changes. Tiredness (fatigue). What increases the risk? The following factors may make you more likely to have migraine headaches: Being between the ages of 56-45 years old. Being female. Having a family history of migraine headaches. Being Caucasian. Having a mental health condition, such as depression or anxiety. Being obese. What are the signs or symptoms? The main symptom of this condition is pulsing or throbbing pain. This pain may: Happen in any area of the head, such as on one or both sides. Make it hard to do daily activities. Get worse with physical activity. Get worse around bright lights, loud noises, or smells. Other symptoms may include: Nausea. Vomiting. Dizziness. Before a migraine headache starts, you may get warning signs (an aura). An aura may include: Seeing flashing lights or having blind spots. Seeing bright spots, halos, or zigzag lines. Having tunnel vision or blurred vision. Having numbness or a tingling feeling. Having trouble talking. Having muscle weakness. After a migraine ends, you may have symptoms. These may include: Feeling tired. Trouble concentrating. How is this diagnosed? A migraine headache can be diagnosed based on: Your symptoms. A physical exam. Tests, such as: A CT scan or an MRI of the head. These tests can help rule out other causes of headaches. Taking fluid from the spine (lumbar puncture) to examine it (cerebrospinal fluid analysis, or CSF analysis). How is this treated? This condition  may be treated with medicines that: Relieve pain and nausea. Prevent migraines. Treatment may also include: Acupuncture. Lifestyle changes like avoiding foods that trigger migraine headaches. Learning ways to control your body (biofeedback). Talk therapy to help you know and deal with negative thoughts (cognitive behavioral therapy). Follow these instructions at home: Medicines Take over-the-counter and prescription medicines only as told by your provider. Ask your provider if the medicine prescribed to you: Requires you to avoid driving or using machinery. Can cause constipation. You may need to take these actions to prevent or treat constipation: Drink enough fluid to keep your pee (urine) pale yellow. Take over-the-counter or prescription medicines. Eat foods that are high in fiber, such as beans, whole grains, and fresh fruits and vegetables. Limit foods that are high in fat and processed sugars, such as fried or sweet foods. Lifestyle  Do not drink alcohol. Do not use any products that contain nicotine or tobacco. These products include cigarettes, chewing tobacco, and vaping devices, such as e-cigarettes. If you need help quitting, ask your  provider. Get 7-9 hours of sleep each night, or the amount recommended by your provider. Find ways to manage stress, such as meditation, deep breathing, or yoga. Try to exercise regularly. This can help lessen how bad and how often your migraines occur. General instructions Keep a journal to find out what triggers your migraines, so you can avoid those things. For example, write down: What you eat and drink. How much sleep you get. Any change to your diet or medicines. If you have a migraine headache: Avoid things that make your symptoms worse, such as bright lights. Lie down in a dark, Villa room. Do not drive or use machinery. Ask your provider what activities are safe for you while you have symptoms. Keep all follow-up visits. Your  provider will monitor your symptoms and recommend any further treatment. Where to find more information Coalition for Headache and Migraine Patients (CHAMP): headachemigraine.org American Migraine Foundation: americanmigrainefoundation.org National Headache Foundation: headaches.org Contact a health care provider if: You have symptoms that are different or worse than your usual migraine headache symptoms. You have more than 15 days of headaches in one month. Get help right away if: Your migraine headache becomes severe or lasts more than 72 hours. You have a fever or stiff neck. You have vision loss. Your muscles feel weak or like you cannot control them. You lose your balance often or have trouble walking. You faint. You have a seizure. This information is not intended to replace advice given to you by your health care provider. Make sure you discuss any questions you have with your health care provider. Document Revised: 05/16/2022 Document Reviewed: 05/16/2022 Elsevier Patient Education  2024 Elsevier Inc.    If you have been instructed to have an in-person evaluation today at a local Urgent Care facility, please use the link below. It will take you to a list of all of our available [facility], including address, phone number and hours of operation. Please do not delay care.  [facility]  If you or a family member do not have a primary care provider, use the link below to schedule a visit and establish care. When you choose a [facility] primary care physician or advanced practice provider, you gain a long-term partner in health. Find a Primary Care Provider  Learn more about [facility]'s in-office and virtual care options:  - Get Care Now

## 2023-08-28 NOTE — Progress Notes (Signed)
Virtual Visit Consent   BRISTAL HOOTER, you are scheduled for a virtual visit with a [facility] provider today. Just as with appointments in the office, your consent must be obtained to participate. Your consent will be active for this visit and any virtual visit you may have with one of our providers in the next 365 days. If you have a MyChart account, a copy of this consent can be sent to you electronically.  As this is a virtual visit, video technology does not allow for your provider to perform a traditional examination. This may limit your provider's ability to fully assess your condition. If your provider identifies any concerns that need to be evaluated in person or the need to arrange testing (such as labs, EKG, etc.), we will make arrangements to do so. Although advances in technology are sophisticated, we cannot ensure that it will always work on either your end or our end. If the connection with a video visit is poor, the visit may have to be switched to a telephone visit. With either a video or telephone visit, we are not always able to ensure that we have a secure connection.  By engaging in this virtual visit, you consent to the provision of healthcare and authorize for your insurance to be billed (if applicable) for the services provided during this visit. Depending on your insurance coverage, you may receive a charge related to this service.  I need to obtain your verbal consent now. Are you willing to proceed with your visit today? Peggy Villa has provided verbal consent on 08/28/2023 for a virtual visit (video or telephone). Peggy Loveless, PA-C  Date: 08/28/2023 6:31 PM  Virtual Visit via Video Note   I, Peggy Villa, connected with  Peggy Villa  ([id number], [birth year]) on 08/28/23 at  7:15 PM EST by a video-enabled telemedicine application and verified that I am speaking with the correct person using two identifiers.  Location: Patient: Virtual  Visit Location Patient: Home Provider: Virtual Visit Location Provider: Home Office   I discussed the limitations of evaluation and management by telemedicine and the availability of in person appointments. The patient expressed understanding and agreed to proceed.    History of Present Illness: Peggy Villa is a 57 y.o. who identifies as a female who was assigned female at birth, and is being seen today for migraine and facial pain.  HPI: Headache  This is a new problem. The current episode started today. The problem occurs constantly. The problem has been unchanged. The pain is located in the Left unilateral and retro-orbital region. The pain radiates to the face. The pain quality is similar to prior headaches. The quality of the pain is described as aching, stabbing and throbbing. The pain is at a severity of 9/10. The pain is severe. Associated symptoms include nausea, phonophobia, photophobia and sinus pressure. Pertinent negatives include no anorexia, blurred vision, coughing, dizziness, ear pain, eye pain, eye redness, eye watering, fever, hearing loss, loss of balance, muscle aches, neck pain, numbness, rhinorrhea, scalp tenderness, sore throat, swollen glands, tingling, tinnitus, visual change, vomiting or weakness. She has tried cold packs, NSAIDs and darkened room (ibuprofen) for the symptoms. The treatment provided mild relief. Her past medical history is significant for migraine headaches and sinus disease.  O2 sat 94% HR: 67 Negative at home Covid 19 testing  She does not feel it is a dental infection as she has no teeth.  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   T2DM (type 2 diabetes mellitus) (HCC) 10/06/2022   Asthma exacerbation 10/04/2022   Acute respiratory failure with hypoxia (HCC) 10/04/2022   Hypertensive urgency 10/04/2022   GERD without  esophagitis 10/04/2022   Depression 10/04/2022   Tobacco abuse 10/04/2022   Shoulder pain 12/02/2016   Essential hypertension 09/11/2014   Hypothyroidism 09/11/2014   Urinary symptom or sign 09/11/2014    Allergies:  Allergies  Allergen Reactions   Milk Protein Anaphylaxis   Penicillin G Swelling   Penicillins Swelling   Milk-Related Compounds    Other Other (See Comments)   Peanut-Containing Drug Products    Medications:  Current Outpatient Medications:    doxycycline (VIBRA-TABS) 100 MG tablet, Take 1 tablet (100 mg total) by mouth 2 (two) times daily., Disp: 14 tablet, Rfl: 0   ondansetron (ZOFRAN-ODT) 4 MG disintegrating tablet, Take 1 tablet (4 mg total) by mouth every 8 (eight) hours as needed., Disp: 20 tablet, Rfl: 0   predniSONE (DELTASONE) 20 MG tablet, Take 2 tablets (40 mg total) by mouth daily with breakfast., Disp: 10 tablet, Rfl: 0   albuterol (PROVENTIL HFA;VENTOLIN HFA) 108 (90 Base) MCG/ACT inhaler, Inhale 2-4 puffs by mouth every 4 hours as needed for wheezing, cough, and/or shortness of breath, Disp: 1 Inhaler, Rfl: 1   amLODipine (NORVASC) 10 MG tablet, Take 10 mg by mouth daily., Disp: , Rfl:    EPINEPHrine 0.3 mg/0.3 mL IJ SOAJ injection, Inject 0.3 mg into the muscle as needed for anaphylaxis., Disp: 1 each, Rfl: 0   escitalopram (LEXAPRO) 10 MG tablet, Take 10 mg by mouth daily., Disp: , Rfl:    fluticasone-salmeterol (ADVAIR) 250-50 MCG/ACT AEPB, Inhale 1 puff into the lungs 2 (two) times daily., Disp: 60 each, Rfl: 11   furosemide (LASIX) 40 MG tablet, Take 40 mg by mouth daily., Disp: , Rfl:    NIFEdipine (PROCARDIA XL/NIFEDICAL XL) 60 MG 24 hr tablet, Take 60 mg by mouth daily., Disp: , Rfl:    pantoprazole (PROTONIX) 40 MG tablet, Take 40 mg by mouth daily., Disp: , Rfl:    solifenacin (VESICARE) 10 MG tablet, Take 10 mg by mouth daily., Disp: , Rfl:    Observations/Objective: Patient is well-developed, well-nourished in no acute distress.  Resting  comfortably at home.  Head is normocephalic, atraumatic.  No labored breathing.  Speech is clear and coherent with logical content.  Patient is alert and oriented at baseline.    Assessment and Plan: 1. Other migraine without status migrainosus, not intractable - doxycycline (VIBRA-TABS) 100 MG tablet; Take 1 tablet (100 mg total) by mouth 2 (two) times daily.  Dispense: 14 tablet; Refill: 0 - predniSONE (DELTASONE) 20 MG tablet; Take 2 tablets (40 mg total) by mouth daily with breakfast.  Dispense: 10 tablet; Refill: 0  2. Acute recurrent maxillary sinusitis - doxycycline (VIBRA-TABS) 100 MG tablet; Take 1 tablet (100 mg total) by mouth 2 (two) times daily.  Dispense: 14 tablet; Refill: 0  3. Nausea - ondansetron (ZOFRAN-ODT) 4 MG disintegrating tablet; Take 1 tablet (4 mg total) by mouth every 8 (eight) hours as needed.  Dispense: 20 tablet; Refill: 0  - Suspect migraine, but has been a while since she has had one and was not around one of her normal triggers - Possible sinus related as pain is located in the left maxillary region and around the left eye; recently treated for  COPD exacerbation and sinus issues with Doxycycline 100mg  x 7 days (completed on 08/23/23) - Will retreat for sinus congestion with Doxycycline 100mg  twice daily for 7 days - Prednisone for Migraine (failed NSAIDs); Normally would just go get a Toradol shot, never on abortive Rx - Zofran for nausea - Push fluids - Advised to seek in person evaluation if headache worsens or fails to improve  Follow Up Instructions: I discussed the assessment and treatment plan with the patient. The patient was provided an opportunity to ask questions and all were answered. The patient agreed with the plan and demonstrated an understanding of the instructions.  A copy of instructions were sent to the patient via MyChart unless otherwise noted below.    The patient was advised to call back or seek an in-person evaluation if the  symptoms worsen or if the condition fails to improve as anticipated.    Peggy Loveless, PA-C

## 2023-08-28 NOTE — Progress Notes (Signed)
The patient no-showed for appointment despite this provider sending direct link with no response and waiting for at least 10 minutes from appointment time for patient to join. They will be marked as a NS for this appointment/time.   Maryon Kemnitz M Kenzie Flakes, PA-C    

## 2023-09-14 ENCOUNTER — Ambulatory Visit
Admission: EM | Admit: 2023-09-14 | Discharge: 2023-09-14 | Disposition: A | Discharge status: Home / Self Care | Payer: BC Managed Care – PPO | Attending: Emergency Medicine | Admitting: Emergency Medicine

## 2023-09-14 ENCOUNTER — Encounter: Payer: Self-pay | Admitting: Emergency Medicine

## 2023-09-14 DIAGNOSIS — H9201 Otalgia, right ear: Secondary | ICD-10-CM | POA: Diagnosis not present

## 2023-09-14 MED ORDER — PREDNISONE 10 MG (21) PO TBPK: ORAL_TABLET | Freq: Every day | ORAL | 0 refills | Status: DC

## 2023-09-14 MED ORDER — AZITHROMYCIN 250 MG PO TABS: 250.0000 mg | ORAL_TABLET | Freq: Every day | ORAL | 0 refills | Status: AC

## 2023-09-14 MED ORDER — KETOROLAC TROMETHAMINE 30 MG/ML IJ SOLN
30.0000 mg | Freq: Once | INTRAMUSCULAR | Status: AC
  Administered 2023-09-14: 30 mg via INTRAMUSCULAR

## 2023-09-14 MED ORDER — ACETAMINOPHEN-CODEINE 300-30 MG PO TABS
1.0000 | ORAL_TABLET | Freq: Four times a day (QID) | ORAL | 0 refills | Status: AC | PRN
Start: 2023-09-14 — End: ?

## 2023-09-14 NOTE — ED Provider Notes (Signed)
[facility]    CSN: [id number] Arrival date & time: 09/14/23  1513      History   Chief Complaint Chief Complaint  Patient presents with   Otalgia    HPI Peggy Villa is a 57 y.o. female.   Patient presents for evaluation of persisting right ear pain and fullness present for 2 weeks.  Pain radiates down the right side of the neck and endorses pain with palpation behind the ear.  Interfering with sleep.  Initially experiencing sinus pressure on the right side which resolved after 1 12 days of doxycycline prescribed for sinusitis.  Additionally took prednisone course which she endorses was ineffective.  Has attempted use of ibuprofen, deemed ineffective.  Tea tree oil gave relief for about 20 seconds.    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      Home Medications    Prior to Admission medications   Medication Sig Start Date End Date Taking? Authorizing Provider  albuterol (PROVENTIL HFA;VENTOLIN HFA) 108 (90 Base) MCG/ACT inhaler Inhale 2-4 puffs by mouth every 4 hours as needed for  wheezing, cough, and/or shortness of breath 12/26/18   Loleta Rose, MD  amLODipine (NORVASC) 10 MG tablet Take 10 mg by mouth daily. 04/12/22   [provider]  doxycycline (VIBRA-TABS) 100 MG tablet Take 1 tablet (100 mg total) by mouth 2 (two) times daily. 08/28/23   [name], PA-C  EPINEPHrine 0.3 mg/0.3 mL IJ SOAJ injection Inject 0.3 mg into the muscle as needed for anaphylaxis. 06/06/23   Becky Augusta, NP  escitalopram (LEXAPRO) 10 MG tablet Take 10 mg by mouth daily.    [provider]  fluticasone-salmeterol (ADVAIR) 250-50 MCG/ACT AEPB Inhale 1 puff into the lungs 2 (two) times daily. 08/21/23 08/20/24  Raechel Chute, MD  furosemide (LASIX) 40 MG tablet Take 40 mg by mouth daily. 01/31/23   [provider]  NIFEdipine (PROCARDIA XL/NIFEDICAL XL) 60 MG 24 hr tablet Take 60 mg by mouth daily. 07/11/23   [provider]  ondansetron (ZOFRAN-ODT) 4 MG disintegrating tablet Take 1 tablet (4 mg total) by mouth every 8 (eight) hours as needed. 08/28/23   [name], PA-C  pantoprazole (PROTONIX) 40 MG tablet Take 40 mg by mouth daily. 04/12/22   [provider]  predniSONE (DELTASONE) 20 MG tablet Take 2 tablets (40 mg total) by mouth daily with breakfast. 08/28/23   [name], PA-C  solifenacin (VESICARE) 10 MG tablet Take 10 mg by mouth daily. 04/12/22   [provider]    Family  History Family History  Problem Relation Age of Onset   Lupus Mother    Cancer Father    Breast cancer Neg Hx    Prostate cancer Neg Hx    Bladder Cancer Neg Hx    Kidney cancer Neg Hx     Social History Social History   Tobacco Use   Smoking status: Former    Current packs/day: 0.00    Average packs/day: 0.3 packs/day for 10.0 years (2.5 ttl pk-yrs)    Types: Cigarettes    Start date: 2009    Quit date: 2019    Years since quitting: 5.9   Smokeless tobacco: Never  Vaping Use   Vaping status: Never Used  Substance Use  Topics   Alcohol use: Yes    Comment: socially   Drug use: Not Currently     Allergies   Milk protein, Penicillin g, Penicillins, Milk-related compounds, Other, and Peanut-containing drug products   Review of Systems Review of Systems   Physical Exam Triage Vital Signs ED Triage Vitals  Encounter Vitals Group     BP 09/14/23 1625 (!) 195/104     Systolic BP Percentile --      Diastolic BP Percentile --      Pulse Rate 09/14/23 1625 90     Resp 09/14/23 1625 18     Temp 09/14/23 1625 98.2 F (36.8 C)     Temp Source 09/14/23 1625 Oral     SpO2 09/14/23 1625 92 %     Weight --      Height --      Head Circumference --      Peak Flow --      Pain Score 09/14/23 1624 10     Pain Loc --      Pain Education --      Exclude from Growth Chart --    No data found.  Updated Vital Signs BP (!) 195/104 (BP Location: Right Wrist)   Pulse 90   Temp 98.2 F (36.8 C) (Oral)   Resp 18   LMP 02/01/2016   SpO2 92%   Visual Acuity Right Eye Distance:   Left Eye Distance:   Bilateral Distance:    Right Eye Near:   Left Eye Near:    Bilateral Near:     Physical Exam Constitutional:      Appearance: Normal appearance.  HENT:     Head: Normocephalic.     Right Ear: Tympanic membrane, ear canal and external ear normal.     Left Ear: Tympanic membrane, ear canal and external ear normal.     Nose: Nose normal.     Right Sinus: No maxillary sinus tenderness or frontal sinus tenderness.     Left Sinus: No maxillary sinus tenderness or frontal sinus tenderness.  Pulmonary:     Effort: Pulmonary effort is normal.  Neurological:     Mental Status: She is alert and oriented to person, place, and time. Mental status is at baseline.      UC Treatments / Results  Labs (all labs ordered are listed, but only abnormal results are displayed) Labs Reviewed - No data to display  EKG   Radiology No results found.  Procedures Procedures (including critical care  time)  Medications Ordered in UC Medications - No data to display  Initial Impression / Assessment and Plan / UC Course  I have reviewed the triage vital signs and the nursing notes.  Pertinent labs & imaging results that were available during  my care of the patient were reviewed by me and considered in my medical decision making (see chart for details).  Otalgia of the right ear  No overt abnormality noted on exam, possibly still related to sinusitis, Toradol IM given for pain, prescribed higher dose prednisone taper and azithromycin, Tylenol 3 prescribed for severe pain, PDMP reviewed, low risk ,strongly recommended beginning use of Mucinex and/or Sudafed  or a sinus medication that is over-the-counter, recommended supportive care through Tylenol and warm compresses, advised against ear cleaning, walking referral to ear nose and throat given if symptoms persist Final Clinical Impressions(s) / UC Diagnoses   Final diagnoses:  None   Discharge Instructions   None    ED Prescriptions   None    PDMP not reviewed this encounter.   Valinda Hoar, NP 09/14/23 1721

## 2023-09-14 NOTE — ED Triage Notes (Signed)
Pt c/o right ear pain x 2 weeks. Pt states she was prescribed 2 rounds of doxycyline.

## 2023-09-14 NOTE — Discharge Instructions (Signed)
I believe your symptoms are related to your sinuses as there is no signs of abnormality or infection to the right ear  Begin azithromycin for additional coverage for bacteria in the sinuses  You have been given an injection of Toradol to help reduce pain ideally will see some improvement within the hour  Started tomorrow take prednisone every morning as directed, increased  dosage from last course, while using you may use Tylenol, may take Tylenol 3 for severe pain, be by full codeine will make you feel sleepy  May use warm compresses to the external ear for additional comfort, avoid ear cleaning until all symptoms have resolved  Would suggest that she take a medicine for congestion such as Sudafed or Mucinex or mixture medicines to further help reduce internal congestion  If your symptoms continue to persist would recommend you follow-up with ear nose and throat who are the specialist, information is listed on front page

## 2023-09-20 ENCOUNTER — Encounter: Payer: Self-pay | Admitting: Student in an Organized Health Care Education/Training Program

## 2023-09-20 ENCOUNTER — Ambulatory Visit: Payer: BC Managed Care – PPO | Admitting: Student in an Organized Health Care Education/Training Program

## 2023-09-20 VITALS — BP 126/80 | HR 90 | Temp 97.6°F | Ht 67.0 in | Wt 334.0 lb

## 2023-09-20 DIAGNOSIS — J986 Disorders of diaphragm: Secondary | ICD-10-CM | POA: Diagnosis not present

## 2023-09-20 DIAGNOSIS — G4733 Obstructive sleep apnea (adult) (pediatric): Secondary | ICD-10-CM | POA: Diagnosis not present

## 2023-09-20 DIAGNOSIS — E039 Hypothyroidism, unspecified: Secondary | ICD-10-CM

## 2023-09-20 DIAGNOSIS — J9612 Chronic respiratory failure with hypercapnia: Secondary | ICD-10-CM

## 2023-09-20 DIAGNOSIS — E049 Nontoxic goiter, unspecified: Secondary | ICD-10-CM

## 2023-09-20 NOTE — Progress Notes (Signed)
Assessment & Plan:   #Moderate persistent asthma without complication #Chronic Hypercapnic Respiratory Failure #OSA #Central Sleep Apnea #Obesity Hypoventilation Syndrome #Elevated left hemi-diaphragm #Goiter #Hypothyroidism   Presenting for follow up of shortness of breath with known hypercapnia, history of asthma, and sleep apnea. She has had a CT scan of the chest that demonstrated the chronically elevated left hemi-diaphragm (seen by neurology), and VBG at that time showed hypercapnia. Patient likely also has a component of obesity hypoventilation syndrome (OHS) in addition to OSA and central sleep apnea.   Presents today for follow up but has not yet had an evaluation with endocrinology. Symptoms have been stable and she is compliant with her advair inhaler as well as with BiPAP. CT of the neck performed showing large goiter with mild mass effect on the trachea but without narrowing. I will re-send the referral to endocrinology today.    - Ambulatory referral to Endocrinology - Continue NIPPV nocturnally - Continue Advair 250-50 one puff twice daily - Encouraged weight loss - reschedule c-spine MRI   Return in about 1 year (around 09/19/2024).  I spent 30 minutes caring for this patient today, including preparing to see the patient, obtaining a medical history , reviewing a separately obtained history, performing a medically appropriate examination and/or evaluation, counseling and educating the patient/family/caregiver, ordering medications, tests, or procedures, referring and communicating with other health care professionals (not separately reported), and documenting clinical information in the electronic health record  Raechel Chute, MD [facility]  End of visit medications:  No orders of the defined types were placed in this encounter.    Current Outpatient Medications:    acetaminophen-codeine (TYLENOL #3) 300-30 MG tablet, Take 1 tablet by  mouth every 6 (six) hours as needed for moderate pain (pain score 4-6)., Disp: 12 tablet, Rfl: 0   albuterol (PROVENTIL HFA;VENTOLIN HFA) 108 (90 Base) MCG/ACT inhaler, Inhale 2-4 puffs by mouth every 4 hours as needed for wheezing, cough, and/or shortness of breath, Disp: 1 Inhaler, Rfl: 1   amLODipine (NORVASC) 10 MG tablet, Take 10 mg by mouth daily., Disp: , Rfl:    azithromycin (ZITHROMAX) 250 MG tablet, Take 1 tablet (250 mg total) by mouth daily. Take first 2 tablets together, then 1 every day until finished., Disp: 6 tablet, Rfl: 0   EPINEPHrine 0.3 mg/0.3 mL IJ SOAJ injection, Inject 0.3 mg into the muscle as needed for anaphylaxis., Disp: 1 each, Rfl: 0   escitalopram (LEXAPRO) 10 MG tablet, Take 10 mg by mouth daily., Disp: , Rfl:    fluticasone-salmeterol (ADVAIR) 250-50 MCG/ACT AEPB, Inhale 1 puff into the lungs 2 (two) times daily., Disp: 60 each, Rfl: 11   furosemide (LASIX) 40 MG tablet, Take 40 mg by mouth daily., Disp: , Rfl:    NIFEdipine (PROCARDIA XL/NIFEDICAL XL) 60 MG 24 hr tablet, Take 60 mg by mouth daily., Disp: , Rfl:    ondansetron (ZOFRAN-ODT) 4 MG disintegrating tablet, Take 1 tablet (4 mg total) by mouth every 8 (eight) hours as needed., Disp: 20 tablet, Rfl: 0   pantoprazole (PROTONIX) 40 MG tablet, Take 40 mg by mouth daily., Disp: , Rfl:    Subjective:   PATIENT ID: Peggy Villa GENDER: female DOB: 12/29/1965, MRN: [id number]  Chief Complaint  Patient presents with   Follow-up    Cough,shortness of breath and wheezing.     HPI  Patient is a pleasant 57 year old female with history of diaphragmatic paralysis and sleep disordered breathing presenting today for  follow up.  Symptoms have been stable since her previous visit. She has not had any increase or worsening in her shortness of breath or cough. She continues to feel short of breath with exertion. She has not had any exacerbations since then. She was not seen by endocrinology.   She continues to be  compliant with her NIPPV for both OSA and central sleep apnea. She is working on weight loss. She is compliant with her inhalers. She has a history of asthma that was diagnosed 2 or 3 years ago for which she was briefly placed on Symbicort as well as on albuterol. This was switched to advair a prior visit given insurance coverage. Prior to her initial visit, she was short of breath and was noted to be hypoxic to 85%, prompting her to be evaluated by EMS and transferred to the hospital.  In the ED, her oxygen sats were in the 90s but she did undergo a CT scan of the chest with PE protocol.  This was negative for pulmonary embolism but notable for severe elevation of the left hemidiaphragm with underlying atelectasis/collapse.   Review of her medical record is notable for known elevated left hemidiaphragm. She's had CT scans in the past showing said elevation as well as a sniff test again showing it. She denies any surgeries or trauma's to her neck, chest, or abdomen. She was seen by neurology and c-spine MRI was ordered, but not yet performed.  She is a former smoker, having quit in 2019 and with 10-pack-year smoking history.  She works at the [employer]; they currently do Barrister's clerk.  Ancillary information including prior medications, full medical/surgical/family/social histories, and PFTs (when available) are listed below and have been reviewed.   Review of Systems  Constitutional:  Negative for chills, fever and weight loss.  Respiratory:  Positive for shortness of breath (with exertion). Negative for cough, sputum production and wheezing.   Cardiovascular:  Negative for chest pain and palpitations.     Objective:   Vitals:   09/20/23 1507  BP: 126/80  Pulse: 90  Temp: 97.6 F (36.4 C)  TempSrc: Temporal  SpO2: 95%  Weight: (!) 334 lb (151.5 kg)  Height: 5\' 7"  (1.702 m)   95% on RA BMI Readings from Last 3 Encounters:  09/20/23 52.31 kg/m  08/21/23 52.31 kg/m  08/16/23  44.30 kg/m   Wt Readings from Last 3 Encounters:  09/20/23 (!) 334 lb (151.5 kg)  08/21/23 (!) 334 lb (151.5 kg)  08/16/23 300 lb (136.1 kg)    Physical Exam Constitutional:      General: She is not in acute distress.    Appearance: She is obese. She is not ill-appearing.  HENT:     Head: Normocephalic.     Nose: Nose normal.     Mouth/Throat:     Mouth: Mucous membranes are moist.  Cardiovascular:     Rate and Rhythm: Normal rate and regular rhythm.     Pulses: Normal pulses.     Heart sounds: Normal heart sounds.  Pulmonary:     Effort: Pulmonary effort is normal.     Breath sounds: Normal breath sounds.     Comments: Decreased breath sounds over the left lower lung fields Abdominal:     Palpations: Abdomen is soft.  Neurological:     General: No focal deficit present.     Mental Status: She is alert and oriented to person, place, and time. Mental status is at baseline.  Ancillary Information    Past Medical History:  Diagnosis Date   Asthma    Essential hypertension 09/11/2014   Hypertension    Hypothyroidism 09/11/2014   Obesity    Shoulder pain 12/02/2016     Family History  Problem Relation Age of Onset   Lupus Mother    Cancer Father    Breast cancer Neg Hx    Prostate cancer Neg Hx    Bladder Cancer Neg Hx    Kidney cancer Neg Hx      Past Surgical History:  Procedure Laterality Date   BREAST REDUCTION SURGERY     REDUCTION MAMMAPLASTY Bilateral 2006   TUBAL LIGATION      Social History   Socioeconomic History   Marital status: Widowed    Spouse name: Not on file   Number of children: Not on file   Years of education: Not on file   Highest education level: Not on file  Occupational History   Not on file  Tobacco Use   Smoking status: Former    Current packs/day: 0.00    Average packs/day: 0.3 packs/day for 10.0 years (2.5 ttl pk-yrs)    Types: Cigarettes    Start date: 2009    Quit date: 2019    Years since quitting: 5.9    Smokeless tobacco: Never  Vaping Use   Vaping status: Never Used  Substance and Sexual Activity   Alcohol use: Yes    Comment: socially   Drug use: Not Currently   Sexual activity: Not on file  Other Topics Concern   Not on file  Social History Narrative   Not on file   Social Drivers of Health   Financial Resource Strain: Not on file  Food Insecurity: No Food Insecurity (10/05/2022)   Hunger Vital Sign    Worried About Running Out of Food in the Last Year: Never true    Ran Out of Food in the Last Year: Never true  Transportation Needs: No Transportation Needs (10/05/2022)   PRAPARE - Administrator, Civil Service (Medical): No    Lack of Transportation (Non-Medical): No  Physical Activity: Not on file  Stress: Not on file  Social Connections: Not on file  Intimate Partner Violence: Not At Risk (10/05/2022)   Humiliation, Afraid, Rape, and Kick questionnaire    Fear of Current or Ex-Partner: No    Emotionally Abused: No    Physically Abused: No    Sexually Abused: No     Allergies  Allergen Reactions   Milk Protein Anaphylaxis   Penicillin G Swelling   Penicillins Swelling   Milk-Related Compounds    Other Other (See Comments)   Peanut-Containing Drug Products      CBC    Component Value Date/Time   WBC 11.8 (H) 10/12/2022 1656   RBC 5.18 (H) 10/12/2022 1656   HGB 13.4 10/12/2022 1656   HGB 11.1 (L) 07/18/2013 2128   HCT 45.6 10/12/2022 1656   HCT 34.4 (L) 07/18/2013 2128   PLT 237 10/12/2022 1656   PLT 227 07/18/2013 2128   MCV 88.0 10/12/2022 1656   MCV 76 (L) 07/18/2013 2128   MCH 25.9 (L) 10/12/2022 1656   MCHC 29.4 (L) 10/12/2022 1656   RDW 13.1 10/12/2022 1656   RDW 15.2 (H) 07/18/2013 2128   LYMPHSABS 1.8 10/12/2022 1656   MONOABS 0.7 10/12/2022 1656   EOSABS 0.1 10/12/2022 1656   BASOSABS 0.0 10/12/2022 1656    Pulmonary Functions Testing Results:  Latest Ref Rng & Units 11/29/2022   11:30 AM  PFT Results  FVC-Pre L 0.86    FVC-Predicted Pre % 22   FVC-Post L 1.06   FVC-Predicted Post % 27   Pre FEV1/FVC % % 88   Post FEV1/FCV % % 90   FEV1-Pre L 0.76   FEV1-Predicted Pre % 25   FEV1-Post L 0.96   DLCO uncorrected ml/min/mmHg 12.02   DLCO UNC% % 52   DLVA Predicted % 132   TLC L 2.35   TLC % Predicted % 42   RV % Predicted % 74     Outpatient Medications Prior to Visit  Medication Sig Dispense Refill   acetaminophen-codeine (TYLENOL #3) 300-30 MG tablet Take 1 tablet by mouth every 6 (six) hours as needed for moderate pain (pain score 4-6). 12 tablet 0   albuterol (PROVENTIL HFA;VENTOLIN HFA) 108 (90 Base) MCG/ACT inhaler Inhale 2-4 puffs by mouth every 4 hours as needed for wheezing, cough, and/or shortness of breath 1 Inhaler 1   amLODipine (NORVASC) 10 MG tablet Take 10 mg by mouth daily.     azithromycin (ZITHROMAX) 250 MG tablet Take 1 tablet (250 mg total) by mouth daily. Take first 2 tablets together, then 1 every day until finished. 6 tablet 0   EPINEPHrine 0.3 mg/0.3 mL IJ SOAJ injection Inject 0.3 mg into the muscle as needed for anaphylaxis. 1 each 0   escitalopram (LEXAPRO) 10 MG tablet Take 10 mg by mouth daily.     fluticasone-salmeterol (ADVAIR) 250-50 MCG/ACT AEPB Inhale 1 puff into the lungs 2 (two) times daily. 60 each 11   furosemide (LASIX) 40 MG tablet Take 40 mg by mouth daily.     NIFEdipine (PROCARDIA XL/NIFEDICAL XL) 60 MG 24 hr tablet Take 60 mg by mouth daily.     ondansetron (ZOFRAN-ODT) 4 MG disintegrating tablet Take 1 tablet (4 mg total) by mouth every 8 (eight) hours as needed. 20 tablet 0   pantoprazole (PROTONIX) 40 MG tablet Take 40 mg by mouth daily.     doxycycline (VIBRA-TABS) 100 MG tablet Take 1 tablet (100 mg total) by mouth 2 (two) times daily. (Patient not taking: Reported on 09/20/2023) 14 tablet 0   predniSONE (STERAPRED UNI-PAK 21 TAB) 10 MG (21) TBPK tablet Take by mouth daily. Take 6 tabs by mouth daily  for 1 days, then 5 tabs for 1 days, then 4 tabs for 1  days, then 3 tabs for 1 days, 2 tabs for 1 days, then 1 tab by mouth daily for 1 days (Patient not taking: Reported on 09/20/2023) 21 tablet 0   solifenacin (VESICARE) 10 MG tablet Take 10 mg by mouth daily. (Patient not taking: Reported on 09/20/2023)     No facility-administered medications prior to visit.

## 2023-12-02 ENCOUNTER — Emergency Department

## 2023-12-02 ENCOUNTER — Emergency Department
Admission: EM | Admit: 2023-12-02 | Discharge: 2023-12-02 | Disposition: A | Discharge status: Home / Self Care | Attending: Emergency Medicine | Admitting: Emergency Medicine

## 2023-12-02 ENCOUNTER — Other Ambulatory Visit: Payer: Self-pay

## 2023-12-02 ENCOUNTER — Encounter: Payer: Self-pay | Admitting: Emergency Medicine

## 2023-12-02 DIAGNOSIS — J45901 Unspecified asthma with (acute) exacerbation: Secondary | ICD-10-CM | POA: Insufficient documentation

## 2023-12-02 DIAGNOSIS — Z7951 Long term (current) use of inhaled steroids: Secondary | ICD-10-CM | POA: Insufficient documentation

## 2023-12-02 DIAGNOSIS — U071 COVID-19: Secondary | ICD-10-CM | POA: Insufficient documentation

## 2023-12-02 DIAGNOSIS — R059 Cough, unspecified: Secondary | ICD-10-CM | POA: Diagnosis present

## 2023-12-02 DIAGNOSIS — J4520 Mild intermittent asthma, uncomplicated: Secondary | ICD-10-CM

## 2023-12-02 LAB — RESP PANEL BY RT-PCR (RSV, FLU A&B, COVID)  RVPGX2
Influenza A by PCR: NEGATIVE
Influenza B by PCR: NEGATIVE
Resp Syncytial Virus by PCR: NEGATIVE
SARS Coronavirus 2 by RT PCR: POSITIVE — AB

## 2023-12-02 MED ORDER — IPRATROPIUM-ALBUTEROL 0.5-2.5 (3) MG/3ML IN SOLN
3.0000 mL | Freq: Once | RESPIRATORY_TRACT | Status: AC
  Administered 2023-12-02: 3 mL via RESPIRATORY_TRACT
  Filled 2023-12-02: qty 3

## 2023-12-02 MED ORDER — PREDNISONE 50 MG PO TABS: 50.0000 mg | ORAL_TABLET | Freq: Every day | ORAL | 0 refills | Status: AC

## 2023-12-02 MED ORDER — PREDNISONE 20 MG PO TABS
60.0000 mg | ORAL_TABLET | Freq: Once | ORAL | Status: AC
  Administered 2023-12-02: 60 mg via ORAL
  Filled 2023-12-02: qty 3

## 2023-12-02 MED ORDER — NIRMATRELVIR/RITONAVIR (PAXLOVID)TABLET: 3.0000 | ORAL_TABLET | Freq: Two times a day (BID) | ORAL | 0 refills | Status: AC

## 2023-12-02 NOTE — ED Triage Notes (Signed)
 Cough and SOB started this morning. Pt family member had COVID last week. Denies Fever, Denies pain. PT states wears oxygen at night. Check sats this afternoon and it dropped to mid 80s.

## 2023-12-02 NOTE — ED Provider Notes (Signed)
 [facility] Provider Note    Event Date/Time   First MD Initiated Contact with Patient 12/02/23 1928     (approximate)   History   Cough and Shortness of Breath   HPI  Peggy Villa is a 58 y.o. female who presents today with history of 2 days of shortness of breath, wheezing, decreased appetite.  Her son was COVID-positive last week.  Patient used nebulizer at home without any improvement and saturations were not 89.  Patient does not have history of diabetes     Physical Exam   Triage Vital Signs: ED Triage Vitals  Encounter Vitals Group     BP 12/02/23 1624 129/76     Systolic BP Percentile --      Diastolic BP Percentile --      Pulse Rate 12/02/23 1624 87     Resp 12/02/23 1624 18     Temp 12/02/23 1624 98.1 F (36.7 C)     Temp Source 12/02/23 1624 Oral     SpO2 12/02/23 1624 93 %     Weight 12/02/23 1625 (!) 334 lb (151.5 kg)     Height --      Head Circumference --      Peak Flow --      Pain Score 12/02/23 1625 0     Pain Loc --      Pain Education --      Exclude from Growth Chart --     Most recent vital signs: Vitals:   12/02/23 1624  BP: 129/76  Pulse: 87  Resp: 18  Temp: 98.1 F (36.7 C)  SpO2: 93%     Constitutional: Alert , mild distress, saturations in 97 Eyes: Conjunctivae are normal.  Head: Atraumatic. Nose: No congestion/rhinnorhea. Mouth/Throat: Mucous membranes are moist.   Neck: Painless ROM.  Cardiovascular:   Good peripheral circulation.  Rhythm Respiratory: Normal respiratory effort.  No retractions.  Bilateral wheezing Gastrointestinal: Soft and nontender.  Musculoskeletal:  no deformity Neurologic:  MAE spontaneously. No gross focal neurologic deficits are appreciated.  Skin:  Skin is warm, dry and intact. No rash noted. Psychiatric: Mood and affect are normal. Speech and behavior are normal.    ED Results / Procedures / Treatments   Labs (all labs ordered are listed, but only abnormal  results are displayed) Labs Reviewed  RESP PANEL BY RT-PCR (RSV, FLU A&B, COVID)  [id number] - Abnormal; Notable for the following components:      Result Value   SARS Coronavirus 2 by RT PCR POSITIVE (*)    All other components within normal limits     EKG See physician read    RADIOLOGY I independently reviewed and interpreted imaging and agree with radiologists findings.      PROCEDURES:  Critical Care performed:   Procedures   MEDICATIONS ORDERED IN ED: Medications  ipratropium-albuterol (DUONEB) 0.5-2.5 (3) MG/3ML nebulizer solution 3 mL (has no administration in time range)  predniSONE (DELTASONE) tablet 60 mg (has no administration in time range)     IMPRESSION / MDM / ASSESSMENT AND PLAN / ED COURSE  I reviewed the triage vital signs and the nursing notes.  Differential diagnosis includes, but is not limited to, COVID, influenza, asthma exacerbation, pneumonia  Patient's presentation is most consistent with acute complicated illness / injury requiring diagnostic workup.   Patient's diagnosis is consistent with COVID, asthma exacerbation. I independently reviewed and interpreted imaging and agree with radiologists findings ruling out pneumonia. Labs are  reassuring.  I did review the patient's allergies and medications.during admission patient got 2 nebulizations with DuoNeb prednisone tablet .  Reassessed the patient with improving of the wheezing . The patient is in stable and satisfactory condition for discharge home  Patient will be discharged home with prescriptions for Paxlovid, prednisone. Patient is to follow up with PCP as needed or otherwise directed. Patient is given ED precautions to return to the ED for any worsening or new symptoms. Discussed plan of care with patient, answered all of patient's questions, Patient agreeable to plan of care. Advised patient to take medications according to the instructions on the label. Discussed possible side effects of new  medications. Patient verbalized understanding. Clinical Course as of 12/02/23 2042  Sat Dec 02, 2023  2040 DG Chest 2 View [AE]  2040 Chronic elevation of left hemidiaphragm.  No acute findings. [AE]  2040 Resp panel by RT-PCR (RSV, Flu A&B, Covid) Anterior Nasal Swab(!) COVID-positive [AE]    Clinical Course User Index [AE] Gladys Damme, PA-C     FINAL CLINICAL IMPRESSION(S) / ED DIAGNOSES   Final diagnoses:  None     Rx / DC Orders   ED Discharge Orders     None        Note:  This document was prepared using Dragon voice recognition software and may include unintentional dictation errors.   Gladys Damme, PA-C 12/02/23 2202    Minna Antis, MD 12/02/23 2214

## 2023-12-02 NOTE — Discharge Instructions (Signed)
 You have been diagnosed with COVID, asthma exacerbation.  Please take Paxlovid 3 tablets by mouth 2 times daily for 5 days.  Please take prednisone 1 tablet by mouth daily with breakfast for 3 days.  Please use your inhaler as needed for wheezing.  Please go to your PCP for a follow-up if symptoms persist.  This come back to ED or go to your PCP if symptoms worsen.  Drink plenty of fluids.

## 2024-02-21 ENCOUNTER — Other Ambulatory Visit: Payer: Self-pay | Admitting: Family Medicine

## 2024-02-21 DIAGNOSIS — Z1231 Encounter for screening mammogram for malignant neoplasm of breast: Secondary | ICD-10-CM

## 2024-07-09 ENCOUNTER — Ambulatory Visit: Payer: Self-pay

## 2024-07-09 DIAGNOSIS — Z23 Encounter for immunization: Secondary | ICD-10-CM

## 2024-08-21 ENCOUNTER — Ambulatory Visit: Payer: Self-pay | Admitting: Podiatry

## 2024-08-21 ENCOUNTER — Ambulatory Visit (INDEPENDENT_AMBULATORY_CARE_PROVIDER_SITE_OTHER)

## 2024-08-21 VITALS — Ht 67.0 in | Wt 334.0 lb

## 2024-08-21 DIAGNOSIS — M21619 Bunion of unspecified foot: Secondary | ICD-10-CM

## 2024-08-21 DIAGNOSIS — M79671 Pain in right foot: Secondary | ICD-10-CM | POA: Diagnosis not present

## 2024-08-21 DIAGNOSIS — Q66221 Congenital metatarsus adductus, right foot: Secondary | ICD-10-CM

## 2024-08-21 DIAGNOSIS — L84 Corns and callosities: Secondary | ICD-10-CM | POA: Diagnosis not present

## 2024-08-21 DIAGNOSIS — M21611 Bunion of right foot: Secondary | ICD-10-CM

## 2024-08-21 NOTE — Patient Instructions (Signed)
 Look for urea 40% cream or ointment and apply to the thickened dry skin / calluses. This can be bought over the counter, at a pharmacy or online such as [employer].    More silicone pads can be purchased from:  [email or web address]

## 2024-08-22 NOTE — Progress Notes (Signed)
 Subjective:  Patient ID: Peggy Villa, female    DOB: 07/05/1966,  MRN: [id number]  Chief Complaint  Patient presents with   Foot Problem    RM 1 Patient is here for callus or bunion on right great toe. Patient states numbness in right great toe.    Discussed the use of AI scribe software for clinical note transcription with the patient, who gave verbal consent to proceed.  History of Present Illness Peggy Villa is a 58 year old female who presents with a painful callus on her right toe.  She has had a callus on her right toe for approximately one month. The callus is sore and numb, although she can feel it when touched. It has become slightly darker over time.  She works long shifts, approximately ten hours, and attributes the development of the callus to her work shoes, specifically mentioning discomfort with the current pair of [employer] shoes. She prefers different footwear, indicating that the current shoes may be contributing to the issue. Her shoes were remeasured and she was provided with a wide width, but she still experiences discomfort.      Objective:    Physical Exam VASCULAR: DP and PT pulse palpable. Foot is warm and well-perfused. Capillary fill time is brisk. DERMATOLOGIC: Normal skin turgor, texture, and temperature. No open lesions, rashes, or ulcerations. NEUROLOGIC: Normal sensation to light touch and pressure. No paresthesias. ORTHOPEDIC: Smooth pain-free range of motion of all examined joints. No ecchymosis or bruising. No gross deformity. No pain to palpation. Large painful hyperkeratotic callus on right hallux. Metatarsus adductus, no bunion or bone spur on radiograph.       Results RADIOLOGY Foot X-ray: Metatarsus adductus, no bunion formation, no bone spur in the area of concern (08/21/2024)  PROCEDURE Debridement of callus Description: Debridement of the pinched callus on the right hallux to a comfortable level.   Assessment:   1.  Metatarsus adductus of right foot   2. Callus of foot   3. Pain in right foot [M79.671]      Plan:  Patient was evaluated and treated and all questions answered.  Assessment and Plan Assessment & Plan Painful right hallux callus secondary to metatarsus adductus Presents with a large, painful, hyperkeratotic, and irritated callus on the right hallux, likely due to metatarsus adductus. The callus has been present for approximately one month, with associated numbness and soreness. Radiographs show no bone spurs or bunion formation, but metatarsus adductus is present. The callus is likely exacerbated by shoe wear, particularly steel-toed shoes, causing friction and inflammation. The darkness of the callus is attributed to inflammation rather than skin pigmentation or malignancy. - Debrided the callus to a comfortable level. - Recommended urea 40% cream to soften the callus and alleviate pressure and pain. - Advised using a silicone gel pad from [employer] to reduce pressure from shoes. - Instructed to use a pumice stone daily after bathing to smooth the callus. - Advised to monitor for changes in the callus, such as spreading, and to consider biopsy if changes occur. - Suggested discussing with HR or management about the possibility of using steel toe covers on regular shoes.  Metatarsus adductus of right foot Metatarsus adductus is contributing to the formation of the painful callus on the right hallux. The condition is likely causing the toes to point inward, leading to friction against the steel toe of shoes. - Recommended re-evaluation of shoe structure if symptoms persist or worsen.      No  follow-ups on file.
